# Patient Record
Sex: Male | Born: 1950 | Race: White | Hispanic: No | Marital: Married | Smoking: Never smoker
Health system: Southern US, Community
[De-identification: ages and names within clinical notes are randomized; demographics above are authoritative.]

## PROBLEM LIST (undated history)

## (undated) DIAGNOSIS — E119 Type 2 diabetes mellitus without complications: Secondary | ICD-10-CM

## (undated) DIAGNOSIS — T8859XA Other complications of anesthesia, initial encounter: Secondary | ICD-10-CM

## (undated) DIAGNOSIS — I10 Essential (primary) hypertension: Secondary | ICD-10-CM

## (undated) DIAGNOSIS — E785 Hyperlipidemia, unspecified: Secondary | ICD-10-CM

## (undated) DIAGNOSIS — T4145XA Adverse effect of unspecified anesthetic, initial encounter: Secondary | ICD-10-CM

## (undated) DIAGNOSIS — Z87442 Personal history of urinary calculi: Secondary | ICD-10-CM

## (undated) DIAGNOSIS — Z92241 Personal history of systemic steroid therapy: Secondary | ICD-10-CM

## (undated) HISTORY — PX: BACK SURGERY: SHX140

## (undated) HISTORY — PX: TONSILLECTOMY: SUR1361

## (undated) HISTORY — DX: Hyperlipidemia, unspecified: E78.5

## (undated) HISTORY — PX: OTHER SURGICAL HISTORY: SHX169

---

## 2000-09-04 ENCOUNTER — Encounter: Payer: Self-pay | Admitting: Emergency Medicine

## 2000-09-04 ENCOUNTER — Emergency Department (HOSPITAL_COMMUNITY): Admission: EM | Admit: 2000-09-04 | Discharge: 2000-09-04 | Payer: Self-pay | Admitting: Emergency Medicine

## 2001-01-19 ENCOUNTER — Ambulatory Visit (HOSPITAL_BASED_OUTPATIENT_CLINIC_OR_DEPARTMENT_OTHER): Admission: RE | Admit: 2001-01-19 | Discharge: 2001-01-19 | Payer: Self-pay | Admitting: General Surgery

## 2001-01-19 ENCOUNTER — Encounter (INDEPENDENT_AMBULATORY_CARE_PROVIDER_SITE_OTHER): Payer: Self-pay | Admitting: *Deleted

## 2001-02-03 ENCOUNTER — Emergency Department (HOSPITAL_COMMUNITY): Admission: EM | Admit: 2001-02-03 | Discharge: 2001-02-03 | Payer: Self-pay | Admitting: Emergency Medicine

## 2001-02-03 ENCOUNTER — Ambulatory Visit (HOSPITAL_COMMUNITY): Admission: RE | Admit: 2001-02-03 | Discharge: 2001-02-03 | Payer: Self-pay | Admitting: Urology

## 2001-02-03 ENCOUNTER — Encounter: Payer: Self-pay | Admitting: Emergency Medicine

## 2001-02-04 ENCOUNTER — Encounter: Payer: Self-pay | Admitting: Urology

## 2001-02-04 ENCOUNTER — Ambulatory Visit (HOSPITAL_COMMUNITY): Admission: RE | Admit: 2001-02-04 | Discharge: 2001-02-04 | Payer: Self-pay | Admitting: Urology

## 2006-11-18 ENCOUNTER — Encounter (INDEPENDENT_AMBULATORY_CARE_PROVIDER_SITE_OTHER): Payer: Self-pay | Admitting: Specialist

## 2006-11-18 ENCOUNTER — Ambulatory Visit (HOSPITAL_COMMUNITY): Admission: RE | Admit: 2006-11-18 | Discharge: 2006-11-18 | Payer: Self-pay | Admitting: *Deleted

## 2007-03-23 ENCOUNTER — Ambulatory Visit (HOSPITAL_COMMUNITY): Admission: RE | Admit: 2007-03-23 | Discharge: 2007-03-23 | Payer: Self-pay | Admitting: Orthopedic Surgery

## 2007-04-01 ENCOUNTER — Ambulatory Visit (HOSPITAL_COMMUNITY): Admission: RE | Admit: 2007-04-01 | Discharge: 2007-04-02 | Payer: Self-pay | Admitting: Orthopedic Surgery

## 2011-02-04 NOTE — Op Note (Signed)
Sean Hays, Sean Hays                ACCOUNT NO.:  [id number]   MEDICAL RECORD NO.:  [id number]          PATIENT TYPE:  OIB   LOCATION:  1613                         FACILITY:  [facility]   PHYSICIAN:  Georges Lynch. Gioffre, M.D.DATE OF BIRTH:  November 19, 1950   DATE OF PROCEDURE:  04/01/2007  DATE OF DISCHARGE:  04/02/2007                               OPERATIVE REPORT   SURGEON:  Dr. Darrelyn Hillock.   ASSISTANT:  Dr. Marlowe Kays.   PREOPERATIVE DIAGNOSES:  1. 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  2. 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   POSTOPERATIVE DIAGNOSES:  1. 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  2. 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   OPERATION:  1. Decompression of the lateral recess at L5-S1 on the right for      recess stenosis.  2. Microdiskectomy at L5-S1 on the right.  3. Foraminotomy of the S1 root on the right.   PROCEDURE:  Under general anesthesia, routine orthopedic prep and  draping of the lower back was carried out.  The patient was on spinal  frame.  Two needles were placed in the back for localization purpose, an  x-ray was taken.  At this time, an incision was made over the L5-S1  interspace in the usual fashion. This was done after the second x-ray  was taken to verify the position.  After the muscle was separated from  the lamina, I then inserted the [facility] retractors. I identified the  L5-S1 space, carried out my hemilaminectomy in the usual fashion.  Note  the ligamentum flavum was identified.  We brought the microscope in and  excised the ligamentum flavum.  We went down, the nerve root was  severely swollen and quite tense. We went out and first decompressed the  lateral recess to free up the root and we cauterized the lateral recess  veins with a bipolar.  We then explored the axilla of the root, it was  free.  We went up under the root and there was a  large fragment that  migrated distally up under the root.  This was gently removed.  There  were several other small fragments removed.  We then went up proximally  and went into the disk space in the usual fashion and cleaned out the  space.  We thoroughly irrigated out the area.  We were now able to  easily pass a hockey-stick out the S1 foramen for the root in that area.  We did a nice foraminotomy.  We searched several times for other small  pieces of disk and there were no further ones present.  The nerve root  now was easily mobile.  We then irrigated the area out, loosely applied  some thrombin-soaked Gelfoam.  The wound was closed in layers in the  usual fashion but I  did leave the distal deep portion of the distal  deep part of the wound open  for drainage purposes. The remaining part  of the wound was closed in the  usual fashion. The skin was closed with metal staples.  He had 2 grams  of IV Ancef preop.  He had Toradol 30 mg IV in the operating room at the  end of the procedure.  Sterile dressings were applied.  The patient left  the operating room in satisfactory condition.           ______________________________  Georges Lynch Darrelyn Hillock, M.D.     RAG/MEDQ  D:  04/01/2007  T:  04/02/2007  Job:  [id number]

## 2011-02-07 NOTE — Op Note (Signed)
Sean Hays, Sean Hays                ACCOUNT NO.:  [id number]   MEDICAL RECORD NO.:  [id number]          PATIENT TYPE:  AMB   LOCATION:  ENDO                         FACILITY:  [facility]   PHYSICIAN:  Georgiana Spinner, M.D.    DATE OF BIRTH:  1950/09/24   DATE OF PROCEDURE:  11/18/2006  DATE OF DISCHARGE:                               OPERATIVE REPORT   PROCEDURE:  Colonoscopy with polypectomy and biopsy.   INDICATIONS:  Colon polyps.   ANESTHESIA:  Demerol 100 mg, Versed 10 mg.   PROCEDURE:  With the patient mildly sedated in the left lateral  decubitus position, a rectal examination was performed which was  unremarkable to my exam.  Subsequently, the Pentax videoscopic  colonoscope was inserted into the rectum and passed under direct vision  to the cecum, identified by ileocecal valve and appendiceal orifice both  of which were photographed.  In the cecum, was a polyp that was removed  using snare cautery technique setting of 20/200 blended current.  There  was a small residual amount of polyp removed using hot biopsy forceps  technique with the same setting of 20/200.  From this point, the  colonoscope was slowly withdrawn taking circumferential views of the  colonic mucosa and then stopping only in the rectum which appeared  normal on direct and showed hemorrhoids on retroflexed view.  The  endoscope was straightened and withdrawn.  The patient's vital signs,  pulse oximeter remained stable.  The patient tolerated the procedure  well without apparent complication.   FINDINGS:  Polyp of cecum.  Internal hemorrhoids, otherwise unremarkable  exam.   PLAN:  Await biopsy report.  The patient will call me for results and  follow-up with me as needed as an outpatient.           ______________________________  Georgiana Spinner, M.D.     GMO/MEDQ  D:  11/18/2006  T:  11/18/2006  Job:  [id number]

## 2011-02-07 NOTE — Op Note (Signed)
[facility]  Patient:    Sean Hays, Sean Hays                       MRN: [id number] Proc. Date: 02/03/01 Adm. Date:  [id number] Attending:  Osvaldo Human                           Operative Report  DATE OF BIRTH:  July 07, 1951  REFERRING PHYSICIAN:  Hadassah Pais. Jeannetta Nap, M.D.  UROLOGIST:  Verl Dicker, M.D.  PREOPERATIVE DIAGNOSIS:  A 7 mm left ureteropelvic junction stone.  POSTOPERATIVE DIAGNOSIS:  A 7 mm left ureteropelvic junction stone.  PROCEDURE:  Cystoscopy, retrograde, left double-J stent placement.  ANESTHESIA:  Spinal.  DRAINS:  6 French 26 cm double-J stent.  DESCRIPTION OF PROCEDURE:  The patient was prepped and draped in the dorsal lithotomy position after institution of an adequate level of spinal anesthesia.  A well-lubricated 21 French panendoscope was gently inserted at the urethral meatus, normal urethra and sphincter, nonobstructive prostate, clear efflux right orifice, minimal efflux left orifice.  Right retrograde showed normal course and caliber of the ureter, pelvis, and calices with prompt drainage of three minutes.  Left retrograde showed normal course and caliber of the ureter, filling defects at the UPJ consistent with stone seen on CT scan with proximal hydronephrosis.  A 6 French end-hole catheter was advanced beneath the stones with gentle injection of contrast.  Stones were displaced into the renal pelvis.  End-hole catheter was advanced into the renal pelvis with immediate return of a "hydronephrotic drip."  Guidewire was then passed through the end-hole catheter, coiled nicely in the upper pole calices.  End-hole catheter was withdrawn and replaced with a 6 French 26 cm double-J stent with excellent pigtail formation on guidewire removal.  String was left in place, bladder was drained, cystoscope was removed.  The patient was returned to recovery in satisfactory condition. DD:  02/03/01 TD:  02/03/01 Job:  [id number]

## 2011-07-08 LAB — BASIC METABOLIC PANEL
CO2: 26
Calcium: 9.1
Creatinine, Ser: 0.95
Glucose, Bld: 137 — ABNORMAL HIGH

## 2011-07-08 LAB — APTT: aPTT: 25

## 2013-07-27 ENCOUNTER — Ambulatory Visit (HOSPITAL_COMMUNITY)
Admission: RE | Admit: 2013-07-27 | Discharge: 2013-07-27 | Disposition: A | Discharge status: Home / Self Care | Payer: 59 | Source: Ambulatory Visit | Attending: Orthopedic Surgery | Admitting: Orthopedic Surgery

## 2013-07-27 ENCOUNTER — Encounter (HOSPITAL_COMMUNITY): Payer: Self-pay | Admitting: Pharmacy Technician

## 2013-07-27 ENCOUNTER — Encounter (HOSPITAL_COMMUNITY)
Admission: RE | Admit: 2013-07-27 | Discharge: 2013-07-27 | Disposition: A | Discharge status: Home / Self Care | Payer: 59 | Source: Ambulatory Visit | Attending: Orthopedic Surgery | Admitting: Orthopedic Surgery

## 2013-07-27 ENCOUNTER — Encounter (HOSPITAL_COMMUNITY): Payer: Self-pay

## 2013-07-27 DIAGNOSIS — M5137 Other intervertebral disc degeneration, lumbosacral region: Secondary | ICD-10-CM | POA: Insufficient documentation

## 2013-07-27 DIAGNOSIS — Z01810 Encounter for preprocedural cardiovascular examination: Secondary | ICD-10-CM | POA: Insufficient documentation

## 2013-07-27 DIAGNOSIS — M79609 Pain in unspecified limb: Secondary | ICD-10-CM | POA: Insufficient documentation

## 2013-07-27 DIAGNOSIS — Z01812 Encounter for preprocedural laboratory examination: Secondary | ICD-10-CM | POA: Insufficient documentation

## 2013-07-27 DIAGNOSIS — Z01818 Encounter for other preprocedural examination: Secondary | ICD-10-CM | POA: Insufficient documentation

## 2013-07-27 DIAGNOSIS — Z92241 Personal history of systemic steroid therapy: Secondary | ICD-10-CM

## 2013-07-27 DIAGNOSIS — M51379 Other intervertebral disc degeneration, lumbosacral region without mention of lumbar back pain or lower extremity pain: Secondary | ICD-10-CM | POA: Insufficient documentation

## 2013-07-27 HISTORY — DX: Type 2 diabetes mellitus without complications: E11.9

## 2013-07-27 HISTORY — DX: Personal history of systemic steroid therapy: Z92.241

## 2013-07-27 HISTORY — DX: Essential (primary) hypertension: I10

## 2013-07-27 HISTORY — DX: Personal history of urinary calculi: Z87.442

## 2013-07-27 LAB — COMPREHENSIVE METABOLIC PANEL
AST: 20 U/L (ref 0–37)
Alkaline Phosphatase: 69 U/L (ref 39–117)
CO2: 29 mEq/L (ref 19–32)
Chloride: 100 mEq/L (ref 96–112)
Creatinine, Ser: 1.04 mg/dL (ref 0.50–1.35)
GFR calc non Af Amer: 74 mL/min — ABNORMAL LOW (ref >90)
Potassium: 3.8 mEq/L (ref 3.5–5.1)
Total Bilirubin: 0.5 mg/dL (ref 0.3–1.2)

## 2013-07-27 LAB — URINALYSIS, ROUTINE W REFLEX MICROSCOPIC
Glucose, UA: 250 mg/dL — AB
Hgb urine dipstick: NEGATIVE
Leukocytes, UA: NEGATIVE
Protein, ur: NEGATIVE mg/dL
Specific Gravity, Urine: 1.027 (ref 1.005–1.030)
pH: 5 (ref 5.0–8.0)

## 2013-07-27 LAB — APTT: aPTT: 26 seconds (ref 24–37)

## 2013-07-27 LAB — CBC
HCT: 43.5 % (ref 39.0–52.0)
MCV: 85.8 fL (ref 78.0–100.0)
Platelets: 197 10*3/uL (ref 150–400)
RBC: 5.07 MIL/uL (ref 4.22–5.81)
WBC: 8.4 10*3/uL (ref 4.0–10.5)

## 2013-07-27 LAB — SURGICAL PCR SCREEN: Staphylococcus aureus: NEGATIVE

## 2013-07-27 NOTE — Progress Notes (Signed)
07-27-13 0930 pt. Need MD order entry in Epic -here now. Will Do CBC,CMP,Pt,PTT,UA, Back xray, EKG, CXR. PCR screen.

## 2013-07-27 NOTE — Patient Instructions (Addendum)
20 Sean Hays  07/27/2013   Your procedure is scheduled on:   08-02-2013  Report to [facility] at     1:00  PM.  Call this number if you have problems the morning of surgery: [phone]  Or Presurgical Testing [phone](Alpha Mysliwiec)      Do not eat food:After Midnight.  May have clear liquids:up to 6 Hours before arrival. Nothing after : 0900 AM  Clear liquids include soda, tea, black coffee, apple or grape juice, broth.  Take these medicines the morning of surgery with A SIP OF WATER:  Tylenol. Atenolol. Oxycodone   Do not wear jewelry, make-up or nail polish.  Do not wear lotions, powders, or perfumes. You may wear deodorant.  Do not shave 12 hours prior to first CHG shower(legs and under arms).(face and neck okay.)  Do not bring valuables to the hospital.  Contacts, dentures or removable bridgework, body piercing, hair pins may not be worn into surgery.  Leave suitcase in the car. After surgery it may be brought to your room.  For patients admitted to the hospital, checkout time is 11:00 AM the day of discharge.   Patients discharged the day of surgery will not be allowed to drive home. Must have responsible person with you x 24 hours once discharged.  Name and phone number of your driver: [name]- spouse [phone] home  Special Instructions: CHG(Chlorhedine 4%-"Hibiclens","Betasept","Aplicare") Education officer, community Wash: see special instructions.(avoid face and genitals)   Please read over the following fact sheets that you were given: MRSA Information, Blood Transfusion fact sheet, Incentive Spirometry Instruction.  Remember : Type/Screen "Blue armbands" - may not be removed once applied(would result in being retested if removed).  Failure to follow these instructions may result in Cancellation of your surgery.   Patient signature_______________________________________________________

## 2013-07-31 NOTE — H&P (Signed)
Sean Hays is an 62 y.o. male.   Chief Complaint: back pain HPI: Sean Hays presented with the chief complaint of back pain. He had a microdiscectomy and hemilaminectomy at L5-S1 to the right in 2008. He did extremely well until the beginning of September. 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 He also started experiencing back pain when coughing. Conservative measures including anti-inflammatories, muscle relaxers, and heat application were used but did not relieve the pain. MRI showed moderate to severe neuroforaminal narrowing secondary to a disc herniation that is foraminal at L4-L5 on the left which corresponds with his left LE pain. He has some numbness and tingling in the left LE but no motor deficits.    Past Medical History  Diagnosis Date  . Hypertension   . Diabetes mellitus without complication   . History of kidney stones     X1-litho for this  . H/O steroid therapy 07-27-13    recent Dosepak use -completed 3 days ago    Past Surgical History  Procedure Laterality Date  . Back surgery      '08  . Lithotripsy    . Tonsillectomy     Social History Alcohol use. never consumed alcohol Children. 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 married Tobacco use. never smoker   Allergies: No Known Allergies  Current outpatient prescriptions: acetaminophen (TYLENOL) 500 MG tablet, Take 1,000 mg by mouth every 6 (six) hours as needed for mild pain or moderate pain., Disp: , Rfl: ;   aspirin EC 81 MG tablet, Take 81 mg by mouth daily., Disp: , Rfl: ;   atenolol (TENORMIN) 50 MG tablet, Take 50 mg by mouth 2 (two) times daily., Disp: , Rfl: ;   hydrochlorothiazide (HYDRODIURIL) 25 MG tablet, Take 25 mg by mouth every morning., Disp: , Rfl:  metFORMIN (GLUCOPHAGE) 500 MG tablet, Take 1,000 mg by mouth 2 (two) times daily with a meal., Disp: , Rfl: ;   oxyCODONE-acetaminophen (PERCOCET) 10-325  MG per tablet, Take 1 tablet by mouth every 4 (four) hours as needed for pain., Disp: , Rfl: ;  pravastatin (PRAVACHOL) 20 MG tablet, Take 20 mg by mouth every evening., Disp: , Rfl:   Review of Systems  Constitutional: Negative.   HENT: Negative.   Eyes: Negative.   Respiratory: Negative.   Cardiovascular: Negative.   Gastrointestinal: Negative.   Genitourinary: Negative.   Musculoskeletal: Positive for back pain and joint pain. Negative for falls, myalgias and neck pain.  Skin: Negative.   Neurological: Positive for tingling. Negative for dizziness, tremors, sensory change, focal weakness, seizures and loss of consciousness.  Endo/Heme/Allergies: Negative.   Psychiatric/Behavioral: Negative.     Vitals Weight: 215 lb Height: 69 in Body Surface Area: 2.18 m Body Mass Index: 31.75 kg/m BP: 154/93 (Sitting, Left Arm, Standard)  Hr: 72 bpm   Physical Exam  Constitutional: He is oriented to person, place, and time. He appears well-developed and well-nourished. No distress.  HENT:  Head: Normocephalic and atraumatic.  Right Ear: External ear normal.  Left Ear: External ear normal.  Nose: Nose normal.  Mouth/Throat: Oropharynx is clear and moist.  Eyes: Conjunctivae and EOM are normal.  Neck: Normal range of motion. Neck supple.  Cardiovascular: Normal rate, regular rhythm, normal heart sounds and intact distal pulses.   No murmur heard. Respiratory: Effort normal and breath sounds normal. No respiratory distress. He has no wheezes.  GI: Soft. Bowel sounds are normal.  He exhibits no distension. There is no tenderness.  Musculoskeletal:       Right hip: Normal.       Left hip: Normal.       Right knee: Normal.       Left knee: Normal.       Lumbar back: He exhibits decreased range of motion, tenderness, pain and spasm.       Right lower leg: He exhibits no tenderness and no swelling.       Left lower leg: He exhibits no tenderness and no swelling.  Positive SLR on  the left  Neurological: He is alert and oriented to person, place, and time. He has normal strength and normal reflexes. No sensory deficit.  Skin: No rash noted. He is not diaphoretic. No erythema.     Psychiatric: He has a normal mood and affect. His behavior is normal.     Assessment/Plan Lumbar disc herniaiton, L4-L5 left He needs a lumbar hemilaminectomy and microdiscectomy at L4-L5 on the left. The possible complications of spinal surgery number one could be infection, which is extremely rare. We do use antibiotics prior to the surgery and during surgery and after surgery. Number two is always a slight degree of probability that you could develop a blood clot in your leg after any type of surgery and we try our best to prevent that with aspirin post op when it is safe to begin. The third is a dural leak. That is the spinal fluid leak that could occur. At certain rare times the bone or the disc could literally stick to the dura which is the lining which contains the spinal fluid and we could develop a small tear in that lining which we then patch up. That is an extremely rare complication. The last and final complication is a recurrent disc rupture. That means that you could rupture another small piece of disc later on down the road and there is about a 2% chance of that.   Sean Hays 07/31/2013, 11:32 AM

## 2013-08-02 ENCOUNTER — Encounter (HOSPITAL_COMMUNITY): Payer: 59 | Admitting: Anesthesiology

## 2013-08-02 ENCOUNTER — Ambulatory Visit (HOSPITAL_COMMUNITY)
Admission: RE | Admit: 2013-08-02 | Discharge: 2013-08-03 | Disposition: A | Discharge status: Home / Self Care | Payer: 59 | Source: Ambulatory Visit | Attending: Orthopedic Surgery | Admitting: Orthopedic Surgery

## 2013-08-02 ENCOUNTER — Ambulatory Visit (HOSPITAL_COMMUNITY): Payer: 59

## 2013-08-02 ENCOUNTER — Encounter (HOSPITAL_COMMUNITY)
Admission: RE | Disposition: A | Discharge status: Home / Self Care | Payer: Self-pay | Source: Ambulatory Visit | Attending: Orthopedic Surgery

## 2013-08-02 ENCOUNTER — Encounter (HOSPITAL_COMMUNITY): Payer: Self-pay | Admitting: *Deleted

## 2013-08-02 ENCOUNTER — Ambulatory Visit (HOSPITAL_COMMUNITY): Payer: 59 | Admitting: Anesthesiology

## 2013-08-02 DIAGNOSIS — M48062 Spinal stenosis, lumbar region with neurogenic claudication: Secondary | ICD-10-CM | POA: Diagnosis present

## 2013-08-02 DIAGNOSIS — E119 Type 2 diabetes mellitus without complications: Secondary | ICD-10-CM | POA: Insufficient documentation

## 2013-08-02 DIAGNOSIS — M216X9 Other acquired deformities of unspecified foot: Secondary | ICD-10-CM | POA: Insufficient documentation

## 2013-08-02 DIAGNOSIS — M5126 Other intervertebral disc displacement, lumbar region: Secondary | ICD-10-CM | POA: Insufficient documentation

## 2013-08-02 DIAGNOSIS — M48061 Spinal stenosis, lumbar region without neurogenic claudication: Secondary | ICD-10-CM | POA: Insufficient documentation

## 2013-08-02 DIAGNOSIS — I10 Essential (primary) hypertension: Secondary | ICD-10-CM | POA: Insufficient documentation

## 2013-08-02 HISTORY — PX: LUMBAR LAMINECTOMY/DECOMPRESSION MICRODISCECTOMY: SHX5026

## 2013-08-02 LAB — GLUCOSE, CAPILLARY
Glucose-Capillary: 112 mg/dL — ABNORMAL HIGH (ref 70–99)
Glucose-Capillary: 137 mg/dL — ABNORMAL HIGH (ref 70–99)
Glucose-Capillary: 108 mg/dL — ABNORMAL HIGH (ref 70–99)

## 2013-08-02 LAB — TYPE AND SCREEN
ABO/RH(D): O NEG
Antibody Screen: NEGATIVE

## 2013-08-02 SURGERY — LUMBAR LAMINECTOMY/DECOMPRESSION MICRODISCECTOMY 1 LEVEL
Anesthesia: General | Site: Back | Laterality: Left | Wound class: Clean

## 2013-08-02 MED ORDER — POLYETHYLENE GLYCOL 3350 17 G PO PACK
17.0000 g | PACK | Freq: Every day | ORAL | Status: DC | PRN
  Filled 2013-08-02: qty 1

## 2013-08-02 MED ORDER — METHOCARBAMOL 100 MG/ML IJ SOLN
500.0000 mg | Freq: Four times a day (QID) | INTRAVENOUS | Status: DC | PRN
  Filled 2013-08-02: qty 5

## 2013-08-02 MED ORDER — HYDROMORPHONE HCL PF 1 MG/ML IJ SOLN
0.2500 mg | INTRAMUSCULAR | Status: DC | PRN
  Administered 2013-08-02: 0.5 mg via INTRAVENOUS

## 2013-08-02 MED ORDER — NEOSTIGMINE METHYLSULFATE 1 MG/ML IJ SOLN
INTRAMUSCULAR | Status: DC | PRN
  Administered 2013-08-02: 2 mg via INTRAVENOUS

## 2013-08-02 MED ORDER — FLEET ENEMA 7-19 GM/118ML RE ENEM: 1.0000 | ENEMA | Freq: Once | RECTAL | Status: AC | PRN

## 2013-08-02 MED ORDER — HYDROCODONE-ACETAMINOPHEN 5-325 MG PO TABS
1.0000 | ORAL_TABLET | ORAL | Status: DC | PRN
  Administered 2013-08-03: 1 via ORAL
  Filled 2013-08-02: qty 1

## 2013-08-02 MED ORDER — PROMETHAZINE HCL 25 MG/ML IJ SOLN: 6.2500 mg | INTRAMUSCULAR | Status: DC | PRN

## 2013-08-02 MED ORDER — ACETAMINOPHEN 325 MG PO TABS: 650.0000 mg | ORAL_TABLET | ORAL | Status: DC | PRN

## 2013-08-02 MED ORDER — LIDOCAINE HCL (PF) 2 % IJ SOLN
INTRAMUSCULAR | Status: DC | PRN
  Administered 2013-08-02: 40 mg

## 2013-08-02 MED ORDER — BUPIVACAINE-EPINEPHRINE PF 0.5-1:200000 % IJ SOLN
INTRAMUSCULAR | Status: AC
  Filled 2013-08-02: qty 30

## 2013-08-02 MED ORDER — BUPIVACAINE LIPOSOME 1.3 % IJ SUSP
INTRAMUSCULAR | Status: DC | PRN
  Administered 2013-08-02: 20 mL

## 2013-08-02 MED ORDER — BACITRACIN-NEOMYCIN-POLYMYXIN 400-5-5000 EX OINT
TOPICAL_OINTMENT | CUTANEOUS | Status: AC
  Filled 2013-08-02: qty 1

## 2013-08-02 MED ORDER — BUPIVACAINE LIPOSOME 1.3 % IJ SUSP
20.0000 mL | Freq: Once | INTRAMUSCULAR | Status: DC
  Filled 2013-08-02: qty 20

## 2013-08-02 MED ORDER — ONDANSETRON HCL 4 MG/2ML IJ SOLN
INTRAMUSCULAR | Status: DC | PRN
  Administered 2013-08-02: 4 mg via INTRAVENOUS

## 2013-08-02 MED ORDER — GLYCOPYRROLATE 0.2 MG/ML IJ SOLN
INTRAMUSCULAR | Status: DC | PRN
  Administered 2013-08-02: 0.4 mg via INTRAVENOUS

## 2013-08-02 MED ORDER — BISACODYL 10 MG RE SUPP: 10.0000 mg | Freq: Every day | RECTAL | Status: DC | PRN

## 2013-08-02 MED ORDER — PHENOL 1.4 % MT LIQD: 1.0000 | OROMUCOSAL | Status: DC | PRN

## 2013-08-02 MED ORDER — HYDROMORPHONE HCL PF 1 MG/ML IJ SOLN: 0.5000 mg | INTRAMUSCULAR | Status: DC | PRN

## 2013-08-02 MED ORDER — ACETAMINOPHEN 650 MG RE SUPP: 650.0000 mg | RECTAL | Status: DC | PRN

## 2013-08-02 MED ORDER — MIDAZOLAM HCL 5 MG/5ML IJ SOLN
INTRAMUSCULAR | Status: DC | PRN
  Administered 2013-08-02: 2 mg via INTRAVENOUS

## 2013-08-02 MED ORDER — FENTANYL CITRATE 0.05 MG/ML IJ SOLN
INTRAMUSCULAR | Status: DC | PRN
  Administered 2013-08-02: 100 ug via INTRAVENOUS
  Administered 2013-08-02: 50 ug via INTRAVENOUS
  Administered 2013-08-02: 100 ug via INTRAVENOUS

## 2013-08-02 MED ORDER — INSULIN ASPART 100 UNIT/ML ~~LOC~~ SOLN
0.0000 [IU] | Freq: Three times a day (TID) | SUBCUTANEOUS | Status: DC
  Administered 2013-08-03: 2 [IU] via SUBCUTANEOUS

## 2013-08-02 MED ORDER — THROMBIN 5000 UNITS EX SOLR
CUTANEOUS | Status: DC | PRN
  Administered 2013-08-02: 10000 [IU] via TOPICAL

## 2013-08-02 MED ORDER — HYDROCHLOROTHIAZIDE 25 MG PO TABS
25.0000 mg | ORAL_TABLET | Freq: Every morning | ORAL | Status: DC
  Administered 2013-08-03: 25 mg via ORAL
  Filled 2013-08-02: qty 1

## 2013-08-02 MED ORDER — CEFAZOLIN SODIUM-DEXTROSE 2-3 GM-% IV SOLR
INTRAVENOUS | Status: AC
  Filled 2013-08-02: qty 50

## 2013-08-02 MED ORDER — SODIUM CHLORIDE 0.9 % IR SOLN
Status: DC | PRN
  Administered 2013-08-02: 17:00:00

## 2013-08-02 MED ORDER — OXYCODONE-ACETAMINOPHEN 5-325 MG PO TABS: 1.0000 | ORAL_TABLET | ORAL | Status: DC | PRN

## 2013-08-02 MED ORDER — LACTATED RINGERS IV SOLN
INTRAVENOUS | Status: DC
  Administered 2013-08-02: 18:00:00 via INTRAVENOUS
  Administered 2013-08-02: 1000 mL via INTRAVENOUS

## 2013-08-02 MED ORDER — SIMVASTATIN 5 MG PO TABS
5.0000 mg | ORAL_TABLET | Freq: Every day | ORAL | Status: DC
  Filled 2013-08-02: qty 1

## 2013-08-02 MED ORDER — ONDANSETRON HCL 4 MG/2ML IJ SOLN: 4.0000 mg | INTRAMUSCULAR | Status: DC | PRN

## 2013-08-02 MED ORDER — ATENOLOL 50 MG PO TABS
50.0000 mg | ORAL_TABLET | Freq: Two times a day (BID) | ORAL | Status: DC
  Administered 2013-08-02 – 2013-08-03 (×2): 50 mg via ORAL
  Filled 2013-08-02 (×3): qty 1

## 2013-08-02 MED ORDER — PROPOFOL 10 MG/ML IV BOLUS
INTRAVENOUS | Status: DC | PRN
  Administered 2013-08-02: 200 mg via INTRAVENOUS

## 2013-08-02 MED ORDER — THROMBIN 5000 UNITS EX SOLR
CUTANEOUS | Status: AC
  Filled 2013-08-02: qty 10000

## 2013-08-02 MED ORDER — LACTATED RINGERS IV SOLN
INTRAVENOUS | Status: DC
  Administered 2013-08-02: via INTRAVENOUS

## 2013-08-02 MED ORDER — SUCCINYLCHOLINE CHLORIDE 20 MG/ML IJ SOLN
INTRAMUSCULAR | Status: DC | PRN
  Administered 2013-08-02: 100 mg via INTRAVENOUS

## 2013-08-02 MED ORDER — BUPIVACAINE-EPINEPHRINE 0.5% -1:200000 IJ SOLN
INTRAMUSCULAR | Status: DC | PRN
  Administered 2013-08-02: 20 mL

## 2013-08-02 MED ORDER — METHOCARBAMOL 500 MG PO TABS: 500.0000 mg | ORAL_TABLET | Freq: Four times a day (QID) | ORAL | Status: DC | PRN

## 2013-08-02 MED ORDER — CEFAZOLIN SODIUM-DEXTROSE 2-3 GM-% IV SOLR
2.0000 g | INTRAVENOUS | Status: AC
  Administered 2013-08-02: 2 g via INTRAVENOUS

## 2013-08-02 MED ORDER — HYDROMORPHONE HCL PF 1 MG/ML IJ SOLN
INTRAMUSCULAR | Status: AC
  Filled 2013-08-02: qty 1

## 2013-08-02 MED ORDER — BACITRACIN-NEOMYCIN-POLYMYXIN 400-5-5000 EX OINT
TOPICAL_OINTMENT | CUTANEOUS | Status: DC | PRN
  Administered 2013-08-02: 1 via TOPICAL

## 2013-08-02 MED ORDER — ROCURONIUM BROMIDE 100 MG/10ML IV SOLN
INTRAVENOUS | Status: DC | PRN
  Administered 2013-08-02: 30 mg via INTRAVENOUS
  Administered 2013-08-02 (×2): 10 mg via INTRAVENOUS

## 2013-08-02 MED ORDER — CEFAZOLIN SODIUM 1-5 GM-% IV SOLN
1.0000 g | Freq: Three times a day (TID) | INTRAVENOUS | Status: DC
  Administered 2013-08-02 – 2013-08-03 (×2): 1 g via INTRAVENOUS
  Filled 2013-08-02 (×5): qty 50

## 2013-08-02 MED ORDER — MENTHOL 3 MG MT LOZG: 1.0000 | LOZENGE | OROMUCOSAL | Status: DC | PRN

## 2013-08-02 SURGICAL SUPPLY — 45 items
APL SKNCLS STERI-STRIP NONHPOA (GAUZE/BANDAGES/DRESSINGS) ×1
BAG SPEC THK2 15X12 ZIP CLS (MISCELLANEOUS) ×1
BAG ZIPLOCK 12X15 (MISCELLANEOUS) ×2 IMPLANT
BENZOIN TINCTURE PRP APPL 2/3 (GAUZE/BANDAGES/DRESSINGS) ×2 IMPLANT
CLEANER TIP ELECTROSURG 2X2 (MISCELLANEOUS) ×2 IMPLANT
CLOTH BEACON ORANGE TIMEOUT ST (SAFETY) ×2 IMPLANT
CONT SPECI 4OZ STER CLIK (MISCELLANEOUS) ×2 IMPLANT
DRAIN PENROSE 18X1/4 LTX STRL (WOUND CARE) IMPLANT
DRAPE MICROSCOPE LEICA (MISCELLANEOUS) ×2 IMPLANT
DRAPE POUCH INSTRU U-SHP 10X18 (DRAPES) ×2 IMPLANT
DRAPE SURG 17X11 SM STRL (DRAPES) ×2 IMPLANT
DRSG ADAPTIC 3X8 NADH LF (GAUZE/BANDAGES/DRESSINGS) ×2 IMPLANT
DRSG PAD ABDOMINAL 8X10 ST (GAUZE/BANDAGES/DRESSINGS) ×5 IMPLANT
DURAPREP 26ML APPLICATOR (WOUND CARE) ×2 IMPLANT
ELECT REM PT RETURN 9FT ADLT (ELECTROSURGICAL) ×2
ELECTRODE REM PT RTRN 9FT ADLT (ELECTROSURGICAL) ×1 IMPLANT
GLOVE BIOGEL PI IND STRL 8 (GLOVE) ×2 IMPLANT
GLOVE BIOGEL PI INDICATOR 8 (GLOVE) ×2
GLOVE ECLIPSE 8.0 STRL XLNG CF (GLOVE) ×4 IMPLANT
GOWN PREVENTION PLUS LG XLONG (DISPOSABLE) ×6 IMPLANT
GOWN STRL REIN XL XLG (GOWN DISPOSABLE) ×4 IMPLANT
KIT BASIN OR (CUSTOM PROCEDURE TRAY) ×2 IMPLANT
KIT POSITIONING SURG ANDREWS (MISCELLANEOUS) ×2 IMPLANT
MANIFOLD NEPTUNE II (INSTRUMENTS) ×2 IMPLANT
NDL SPNL 18GX3.5 QUINCKE PK (NEEDLE) ×2 IMPLANT
NEEDLE SPNL 18GX3.5 QUINCKE PK (NEEDLE) ×4 IMPLANT
NS IRRIG 1000ML POUR BTL (IV SOLUTION) ×2 IMPLANT
PATTIES SURGICAL .5 X.5 (GAUZE/BANDAGES/DRESSINGS) IMPLANT
PATTIES SURGICAL .75X.75 (GAUZE/BANDAGES/DRESSINGS) IMPLANT
PATTIES SURGICAL 1X1 (DISPOSABLE) IMPLANT
PIN SAFETY NICK PLATE  2 MED (MISCELLANEOUS)
PIN SAFETY NICK PLATE 2 MED (MISCELLANEOUS) IMPLANT
POSITIONER SURGICAL ARM (MISCELLANEOUS) ×2 IMPLANT
SPONGE GAUZE 4X4 12PLY (GAUZE/BANDAGES/DRESSINGS) ×1 IMPLANT
SPONGE LAP 4X18 X RAY DECT (DISPOSABLE) IMPLANT
SPONGE SURGIFOAM ABS GEL 100 (HEMOSTASIS) ×2 IMPLANT
STAPLER VISISTAT 35W (STAPLE) IMPLANT
SUT VIC AB 0 CT1 27 (SUTURE) ×2
SUT VIC AB 0 CT1 27XBRD ANTBC (SUTURE) ×1 IMPLANT
SUT VIC AB 1 CT1 27 (SUTURE) ×8
SUT VIC AB 1 CT1 27XBRD ANTBC (SUTURE) ×4 IMPLANT
TAPE CLOTH SURG 6X10 WHT LF (GAUZE/BANDAGES/DRESSINGS) ×1 IMPLANT
TOWEL OR 17X26 10 PK STRL BLUE (TOWEL DISPOSABLE) ×4 IMPLANT
TRAY LAMINECTOMY (CUSTOM PROCEDURE TRAY) ×2 IMPLANT
WATER STERILE IRR 1500ML POUR (IV SOLUTION) ×2 IMPLANT

## 2013-08-02 NOTE — Transfer of Care (Signed)
Immediate Anesthesia Transfer of Care Note  Patient: Sean Hays  Procedure(s) Performed: Procedure(s): LUMBAR LAMINECTOMY L4 - L5/MICRODISCECTOMY ON THE LEFT 1 LEVEL (Left)  Patient Location: PACU  Anesthesia Type:General  Level of Consciousness: awake, patient cooperative, confused and responds to stimulation  Airway & Oxygen Therapy: Patient Spontanous Breathing and Patient connected to face mask oxygen  Post-op Assessment: Report given to PACU RN, Post -op Vital signs reviewed and stable and Patient moving all extremities  Post vital signs: Reviewed and stable  Complications: No apparent anesthesia complications

## 2013-08-02 NOTE — Brief Op Note (Signed)
08/02/2013  6:20 PM  PATIENT:  Sean Hays  62 y.o. male  PRE-OPERATIVE DIAGNOSIS:  Herniated DiscL-4-L-5 on the left and Severe Lateral Recess Stenosis  POST-OPERATIVE DIAGNOSIS:  Herniated Disc at L-4-L-5 on the left and Severe Lateral Recess Stenosis  PROCEDURE:  Procedure(s): LUMBAR LAMINECTOMY L4 - L5/MICRODISCECTOMY ON THE LEFT 1 LEVEL (Left) and Decompression of Lateral Recess on the Left at L-4-L-5 and Foraminotomies for TWO Nerve Roots left-4 and L-5.  SURGEON:  Surgeon(s) and Role:    * Jacki Cones, MD - Primary    * Drucilla Schmidt, MD - Assisting    ASSISTANTS: Marlowe Kays MD  ANESTHESIA:   general  EBL:  Total I/O In: -  Out: 100 [Blood:100]  BLOOD ADMINISTERED:none  DRAINS: none   LOCAL MEDICATIONS USED:  MARCAINE 20cc of 0.50% with Epinephrine at beginning of case and 20cc of Exparel at the end of the case.   SPECIMEN:  No Specimen  DISPOSITION OF SPECIMEN:  N/A  COUNTS:  YES  TOURNIQUET:  * No tourniquets in log *  DICTATION: .Other Dictation: Dictation Number [id number]  PLAN OF CARE: Admit for overnight observation  PATIENT DISPOSITION:  PACU - hemodynamically stable.   Delay start of Pharmacological VTE agent (>24hrs) due to surgical blood loss or risk of bleeding: yes

## 2013-08-02 NOTE — Preoperative (Addendum)
Beta Blockers   Reason not to administer Beta Blockers:Not Applicable 

## 2013-08-02 NOTE — Anesthesia Preprocedure Evaluation (Signed)
Anesthesia Evaluation  Patient identified by MRN, date of birth, ID band Patient awake    Reviewed: Allergy & Precautions, H&P , NPO status , Patient's Chart, lab work & pertinent test results  Airway Mallampati: II TM Distance: <3 FB Neck ROM: Full    Dental no notable dental hx.    Pulmonary neg pulmonary ROS,  breath sounds clear to auscultation  Pulmonary exam normal       Cardiovascular hypertension, Pt. on medications Rhythm:Regular Rate:Normal     Neuro/Psych negative neurological ROS  negative psych ROS   GI/Hepatic negative GI ROS, Neg liver ROS,   Endo/Other  diabetes, Type 2  Renal/GU negative Renal ROS  negative genitourinary   Musculoskeletal negative musculoskeletal ROS (+)   Abdominal   Peds negative pediatric ROS (+)  Hematology negative hematology ROS (+)   Anesthesia Other Findings   Reproductive/Obstetrics negative OB ROS                           Anesthesia Physical Anesthesia Plan  ASA: II  Anesthesia Plan: General   Post-op Pain Management:    Induction: Intravenous  Airway Management Planned: Oral ETT  Additional Equipment:   Intra-op Plan:   Post-operative Plan: Extubation in OR  Informed Consent: I have reviewed the patients History and Physical, chart, labs and discussed the procedure including the risks, benefits and alternatives for the proposed anesthesia with the patient or authorized representative who has indicated his/her understanding and acceptance.   Dental advisory given  Plan Discussed with: CRNA and Surgeon  Anesthesia Plan Comments:         Anesthesia Quick Evaluation

## 2013-08-02 NOTE — Anesthesia Postprocedure Evaluation (Signed)
  Anesthesia Post-op Note  Patient: Sean Hays  Procedure(s) Performed: Procedure(s) (LRB): LUMBAR LAMINECTOMY L4 - L5/MICRODISCECTOMY ON THE LEFT 1 LEVEL (Left)  Patient Location: PACU  Anesthesia Type: General  Level of Consciousness: awake and alert   Airway and Oxygen Therapy: Patient Spontanous Breathing  Post-op Pain: mild  Post-op Assessment: Post-op Vital signs reviewed, Patient's Cardiovascular Status Stable, Respiratory Function Stable, Patent Airway and No signs of Nausea or vomiting  Last Vitals:  Filed Vitals:   08/02/13 1845  BP:   Pulse: 75  Temp:   Resp: 20    Post-op Vital Signs: stable   Complications: No apparent anesthesia complications

## 2013-08-02 NOTE — Interval H&P Note (Signed)
History and Physical Interval Note:  08/02/2013 3:28 PM  Sean Hays  has presented today for surgery, with the diagnosis of Herniated Disc  The various methods of treatment have been discussed with the patient and family. After consideration of risks, benefits and other options for treatment, the patient has consented to  Procedure(s): LUMBAR LAMINECTOMY L4 - L5/MICRODISCECTOMY ON THE LEFT 1 LEVEL (Left) as a surgical intervention .  The patient's history has been reviewed, patient examined, no change in status, stable for surgery.  I have reviewed the patient's chart and labs.  Questions were answered to the patient's satisfaction.     [name]

## 2013-08-03 ENCOUNTER — Encounter (HOSPITAL_COMMUNITY): Payer: Self-pay

## 2013-08-03 LAB — GLUCOSE, CAPILLARY: Glucose-Capillary: 150 mg/dL — ABNORMAL HIGH (ref 70–99)

## 2013-08-03 MED ORDER — HYDROCODONE-ACETAMINOPHEN 5-325 MG PO TABS: 1.0000 | ORAL_TABLET | ORAL | Status: DC | PRN

## 2013-08-03 MED ORDER — METHOCARBAMOL 500 MG PO TABS: 500.0000 mg | ORAL_TABLET | Freq: Four times a day (QID) | ORAL | Status: DC | PRN

## 2013-08-03 MED ORDER — OXYCODONE HCL 5 MG PO TABS: 5.0000 mg | ORAL_TABLET | ORAL | Status: DC | PRN

## 2013-08-03 NOTE — Progress Notes (Signed)
Pt discharged home with wife via wheelchair. Pt verbalized understanding d/c instructions, prescriptions, and follow-up care. Pt denies having pain at time of discharge.

## 2013-08-03 NOTE — Progress Notes (Signed)
OT Cancellation Note  Patient Details Name: KYLYN SOOKRAM MRN: [id number] DOB: [birth year]   Cancelled Treatment:     Pt was screened this am for acute OT. Pt and pt's wife reports no needs at this time as he will have assist at home PRN. Will sign off.  Roselie Awkward Dixon 08/03/2013, 9:48 AM

## 2013-08-03 NOTE — Progress Notes (Signed)
   Subjective: 1 Day Post-Op Procedure(s) (LRB): LUMBAR LAMINECTOMY L4 - L5/MICRODISCECTOMY ON THE LEFT 1 LEVEL (Left) Patient reports pain as mild.   Patient seen in rounds without Dr. Darrelyn Hillock. Patient is well, and has had no acute complaints or problems. He reports that he is feeling better although he has some lingering discomfort in his left leg. He did not sleep but a couple hours last night but no issues overnight. No SOB or chest pain. Voiding well.  Plan is to go Home after hospital stay.  Objective: Vital signs in last 24 hours: Temp:  [97.5 F (36.4 C)-99.2 F (37.3 C)] 98.1 F (36.7 C) (11/12 0532) Pulse Rate:  [70-85] 70 (11/12 0532) Resp:  [16-20] 16 (11/12 0532) BP: (103-154)/(53-94) 145/85 mmHg (11/12 0532) SpO2:  [95 %-100 %] 99 % (11/12 0532)  Intake/Output from previous day:  Intake/Output Summary (Last 24 hours) at 08/03/13 0853 Last data filed at 08/02/13 1858  Gross per 24 hour  Intake   1400 ml  Output    100 ml  Net   1300 ml     EXAM General - Patient is Alert and Oriented Extremity - Neurologically intact Neurovascular intact Dorsiflexion/Plantar flexion intact Dressing/Incision - clean, dry, no drainage Motor Function - intact, moving foot and toes well on exam.   Past Medical History  Diagnosis Date  . Hypertension   . Diabetes mellitus without complication   . History of kidney stones     X1-litho for this  . H/O steroid therapy 07-27-13    recent Dosepak use -completed 3 days ago    Assessment/Plan: 1 Day Post-Op Procedure(s) (LRB): LUMBAR LAMINECTOMY L4 - L5/MICRODISCECTOMY ON THE LEFT 1 LEVEL (Left) Active Problems:   Spinal stenosis, lumbar region, with neurogenic claudication  Advance diet Up with therapy D/C IV fluids Discharge home   DVT Prophylaxis - Aspirin Weight-Bearing as tolerated   He is doing well this morning. Will have him walk with PT. If he progresses well, he is prepared for discharge home today. Discharge  instructions given.   Ariannie Penaloza LAUREN 08/03/2013, 8:53 AM

## 2013-08-03 NOTE — Progress Notes (Signed)
Physical Therapy Treatment Patient Details Name: Sean Hays MRN: [id number] DOB: June 13, 1951 Today's Date: 08/03/2013 Time:  -     PT Assessment / Plan / Recommendation  History of Present Illness s/p L4-5 laminectomy   PT Comments     Follow Up Recommendations  No PT follow up     Does the patient have the potential to tolerate intense rehabilitation     Barriers to Discharge        Equipment Recommendations  None recommended by PT    Recommendations for Other Services    Frequency     Progress towards PT Goals    Plan      Precautions / Restrictions Precautions Precautions: Back Precaution Comments: handouts issued Restrictions Other Position/Activity Restrictions: LUMBAR LAMINECTOMY L4 - L5/MICRODISCECTOMY ON THE LEFT 1 LEVEL    Pertinent Vitals/Pain    Mobility  Bed Mobility Bed Mobility: Rolling Left;Left Sidelying to Sit Rolling Left: 5: Supervision Left Sidelying to Sit: 5: Supervision Details for Bed Mobility Assistance: cues for technique Transfers Transfers: Sit to Stand;Stand to Sit Sit to Stand: 5: Supervision;6: Modified independent (Device/Increase time) Stand to Sit: 5: Supervision;6: Modified independent (Device/Increase time) Details for Transfer Assistance: cues for back precautions Ambulation/Gait Ambulation Distance (Feet): 400 Feet Assistive device: None Gait Pattern: Step-through pattern Stairs: Yes Stairs Assistance: 6: Modified independent (Device/Increase time);5: Supervision Stairs Assistance Details (indicate cue type and reason): 5 Stair Management Technique: No rails;One rail Right    Exercises  UE/LEs [facility] for activities tested   PT Diagnosis:    PT Problem List:   PT Treatment Interventions:     PT Goals (current goals can now be found in the care plan section)    Visit Information  Last PT Received On: 08/03/13 Assistance Needed: +1 History of Present Illness: s/p L4-5 laminectomy    Subjective Data       Cognition  Cognition Arousal/Alertness: Awake/alert Behavior During Therapy: WFL for tasks assessed/performed Overall Cognitive Status: Within Functional Limits for tasks assessed    Balance     End of Session PT - End of Session Activity Tolerance: Patient tolerated treatment well Patient left: in chair;with call bell/phone within reach;with family/visitor present Nurse Communication: Mobility status   GP Functional Assessment Tool Used: clinical judgement Functional Limitation: Mobility: Walking and moving around Mobility: Walking and Moving Around Current Status [phone]): At least 1 percent but less than 20 percent impaired, limited or restricted Mobility: Walking and Moving Around Goal Status [phone]): At least 1 percent but less than 20 percent impaired, limited or restricted Mobility: Walking and Moving Around Discharge Status [phone]): At least 1 percent but less than 20 percent impaired, limited or restricted   [facility] 08/03/2013, 4:30 PM

## 2013-08-05 NOTE — Op Note (Signed)
NAMECHANSE, KAGEL                ACCOUNT NO.:  [id number]  MEDICAL RECORD NO.:  [id number]  LOCATION:  1506                         FACILITY:  [facility]  PHYSICIAN:  Georges Lynch. Taevyn Hausen, M.D.DATE OF BIRTH:  [birth year]  DATE OF PROCEDURE: DATE OF DISCHARGE:  08/03/2013                              OPERATIVE REPORT   PREOPERATIVE DIAGNOSIS: 1. 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 2. Partial foot drop on the left. 3. Severe lateral recess stenosis and foraminal stenosis at L4-5 on     the left.  POSTOPERATIVE DIAGNOSIS: 1. 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 2. Partial foot drop on the left. 3. Severe lateral recess stenosis and foraminal stenosis at L4-5 on     the left.  SURGEON:  Georges Lynch. Darrelyn Hillock, M.D.  ASSISTANT:  Marlowe Kays, M.D.  PROCEDURE: 1. Decompression of the lateral recess for severe lateral recess     stenosis at L4-5 on the left. 2. Foraminotomies for the L4 root #3, foraminotomy for the L5 root,     both on the left. 3. Microdiskectomy at L4-5 on the left for foraminal disk.  DESCRIPTION OF PROCEDURE:  Under general anesthesia, routine orthopedic prep and drape in the lower back was carried out.  He had 2 g of IV Ancef.  The appropriate time-out was first carried out.  I also marked the appropriate left side of the back in the holding area.  He had 2 g of IV Ancef.  At this time, 2 needles were placed in the back for localization purposes.  An x-ray was taken.  Following that, an incision was made over the L4-5 space in the midline.  Bleeders were identified and cauterized.  Prior to making incision, I injected 20 mL of 0.5% Marcaine and epinephrine on the both sides of the spinous process.  Once the muscle was stripped from the spinous process of lamina, instruments were in place for another x-ray.  I then went down and carried out a hemilaminectomy at L4-5 in usual  fashion.  A microscope was brought in. Note, the recess and the space was very tight with great deal of time was taken to make sure we did not injure the dura.  At this time, we then carried out our dissection distally and proximally.  I then far laterally to decompress to recess.  We also did foraminotomies for the L4 root and the L5 root on the left.  The ligamentum flavum was removed gently.  At this time, we identified the dura and we identified the L5 root.  We did a nice foraminotomy first to free up the 5 root.  We then gently retracted the root went up proximally and did a foraminotomy for the root above.  We then identified the disk space by needle be in place and displaced and x-ray was taken to verify the L4-5 space on the left. At this time, a cruciate incision was made in the posterior longitudinal ligament.  Bleeders were then identified and cauterized.  I then carried out my microdiskectomy.  I  utilized the nerve hook and the Epstein curettes, went out far laterally into the foramen to make sure we decompressed the disk into the space and we carried out our microdiskectomy.  When the procedure was completed, we had easily accessed to the foramen above and below with a hockey-stick.  We had good freedom of motion of the L5 root as well as the dura.  Thoroughly irrigated out the area, loosely applied some thrombin-soaked Gelfoam and closed the wound layers in usual fashion.  I left a small distal deep and proximal part of the wound open for drainage purposes.  Before closing the subcu and skin, I injected 20 mL of Exparel into the wound site.  The skin and subcu was closed with #1 Vicryl.  The skin was closed with metal staples.  Sterile dressings were applied.  The patient left the operating room in satisfactory condition.          ______________________________ Georges Lynch Darrelyn Hillock, M.D.     RAG/MEDQ  D:  08/02/2013  T:  08/03/2013  Job:  [id number]

## 2013-08-08 NOTE — Progress Notes (Signed)
Physician Discharge Summary   Patient ID: Sean Hays MRN: [id number] DOB/AGE: Nov 01, 1950 62 y.o.  Admit date: 08/02/2013 Discharge date: 08/03/2013  Primary Diagnosis: Spinal stenosis, lumbar spine  Admission Diagnoses:  Past Medical History  Diagnosis Date  . Hypertension   . Diabetes mellitus without complication   . History of kidney stones     X1-litho for this  . H/O steroid therapy 07-27-13    recent Dosepak use -completed 3 days ago   Discharge Diagnoses:   Active Problems:   Spinal stenosis, lumbar region, with neurogenic claudication   Procedure:  Procedure(s) (LRB): LUMBAR LAMINECTOMY L4 - L5/MICRODISCECTOMY ON THE LEFT 1 LEVEL (Left)   Consults: None  HPI: Sean Hays presented with the chief complaint of back pain. He had a microdiscectomy and hemilaminectomy at L5-S1 to the right in 2008. He did extremely well until the beginning of September. 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 He also started experiencing back pain when coughing. Conservative measures including anti-inflammatories, muscle relaxers, and heat application were used but did not relieve the pain. MRI showed moderate to severe neuroforaminal narrowing secondary to a disc herniation that is foraminal at L4-L5 on the left which corresponds with his left LE pain. He has some numbness and tingling in the left LE but no motor deficits.     Laboratory Data: Admission on 08/02/2013, Discharged on 08/03/2013  Component Date Value Range Status  . ABO/RH(D) 08/02/2013 O NEG   Final  . Antibody Screen 08/02/2013 NEG   Final  . Sample Expiration 08/02/2013 08/05/2013   Final  . Glucose-Capillary 08/02/2013 112* 70 - 99 mg/dL Final  . Comment 1 40/98/1191 Documented in Chart   Final  . ABO/RH(D) 08/02/2013 O NEG   Final  . Glucose-Capillary 08/02/2013 137* 70 - 99 mg/dL Final  . Glucose-Capillary 08/02/2013 108* 70 - 99 mg/dL Final  . Glucose-Capillary 08/03/2013  150* 70 - 99 mg/dL Final  Hospital Outpatient Visit on 07/27/2013  Component Date Value Range Status  . WBC 07/27/2013 8.4  4.0 - 10.5 K/uL Final  . RBC 07/27/2013 5.07  4.22 - 5.81 MIL/uL Final  . Hemoglobin 07/27/2013 15.8  13.0 - 17.0 g/dL Final  . HCT 47/82/9562 43.5  39.0 - 52.0 % Final  . MCV 07/27/2013 85.8  78.0 - 100.0 fL Final  . MCH 07/27/2013 31.2  26.0 - 34.0 pg Final  . MCHC 07/27/2013 36.3* 30.0 - 36.0 g/dL Final  . RDW 13/04/6577 12.6  11.5 - 15.5 % Final  . Platelets 07/27/2013 197  150 - 400 K/uL Final  . Sodium 07/27/2013 138  135 - 145 mEq/L Final  . Potassium 07/27/2013 3.8  3.5 - 5.1 mEq/L Final  . Chloride 07/27/2013 100  96 - 112 mEq/L Final  . CO2 07/27/2013 29  19 - 32 mEq/L Final  . Glucose, Bld 07/27/2013 156* 70 - 99 mg/dL Final  . BUN 46/96/2952 21  6 - 23 mg/dL Final  . Creatinine, Ser 07/27/2013 1.04  0.50 - 1.35 mg/dL Final  . Calcium 84/13/2440 10.0  8.4 - 10.5 mg/dL Final  . Total Protein 07/27/2013 7.1  6.0 - 8.3 g/dL Final  . Albumin 07/19/2535 3.9  3.5 - 5.2 g/dL Final  . AST 64/40/3474 20  0 - 37 U/L Final  . ALT 07/27/2013 19  0 - 53 U/L Final  . Alkaline Phosphatase 07/27/2013 69  39 - 117 U/L Final  . Total Bilirubin 07/27/2013 0.5  0.3 - 1.2 mg/dL Final  . GFR calc non Af Amer 07/27/2013 74* >90 mL/min Final  . GFR calc Af Amer 07/27/2013 86* >90 mL/min Final   Comment: (NOTE)                          The eGFR has been calculated using the CKD EPI equation.                          This calculation has not been validated in all clinical situations.                          eGFR's persistently <90 mL/min signify possible Chronic Kidney                          Disease.  Marland Kitchen MRSA, PCR 07/27/2013 NEGATIVE  NEGATIVE Final  . Staphylococcus aureus 07/27/2013 NEGATIVE  NEGATIVE Final   Comment:                                 The Xpert SA Assay (FDA                          approved for NASAL specimens                          in patients over  73 years of age),                          is one component of                          a comprehensive surveillance                          program.  Test performance has                          been validated by [employer] for patients greater                          than or equal to 22 year old.                          It is not intended                          to diagnose infection nor to                          guide or monitor treatment.  . Prothrombin Time 07/27/2013 12.3  11.6 - 15.2 seconds Final  . INR 07/27/2013 0.93  0.00 - 1.49 Final  . Color, Urine 07/27/2013 YELLOW  YELLOW Final  . APPearance 07/27/2013 CLEAR  CLEAR Final  . Specific Gravity, Urine 07/27/2013 1.027  1.005 - 1.030 Final  . pH  07/27/2013 5.0  5.0 - 8.0 Final  . Glucose, UA 07/27/2013 250* NEGATIVE mg/dL Final  . Hgb urine dipstick 07/27/2013 NEGATIVE  NEGATIVE Final  . Bilirubin Urine 07/27/2013 NEGATIVE  NEGATIVE Final  . Ketones, ur 07/27/2013 NEGATIVE  NEGATIVE mg/dL Final  . Protein, ur 40/98/1191 NEGATIVE  NEGATIVE mg/dL Final  . Urobilinogen, UA 07/27/2013 0.2  0.0 - 1.0 mg/dL Final  . Nitrite 47/82/9562 NEGATIVE  NEGATIVE Final  . Leukocytes, UA 07/27/2013 NEGATIVE  NEGATIVE Final   MICROSCOPIC NOT DONE ON URINES WITH NEGATIVE PROTEIN, BLOOD, LEUKOCYTES, NITRITE, OR GLUCOSE <1000 mg/dL.  Marland Kitchen aPTT 07/27/2013 26  24 - 37 seconds Final     X-Rays:Dg Chest 2 View  07/27/2013   CLINICAL DATA:  Preop for lumbar surgery  EXAM: CHEST  2 VIEW  COMPARISON:  DG CHEST 2 VIEW dated 03/31/2007  FINDINGS: The heart size and mediastinal contours are within normal limits. Both lungs are clear. The visualized skeletal structures are unremarkable.  IMPRESSION: No acute cardiopulmonary process.   Electronically Signed   By: Genevive Bi M.D.   On: 07/27/2013 10:54   X-ray Lumbar Spine Ap And Lateral  07/27/2013   CLINICAL DATA:  Low back and left lower extremity pain  EXAM: LUMBAR SPINE  - 2-3 VIEW  COMPARISON:  04/01/2007  FINDINGS: There is no evidence of lumbar spine fracture. Alignment is normal. Mild narrowing of L3-4 and L4-5 interspaces. Small anterior endplate spurs L1-L4.  IMPRESSION: Mild disk narrowing L3-4 and L4-5. No acute bone abnormality.   Electronically Signed   By: Oley Balm M.D.   On: 07/27/2013 11:05   Dg Spine Portable 1 View  08/02/2013   CLINICAL DATA:  L4-5 level  EXAM: PORTABLE SPINE - 1 VIEW  COMPARISON:  Earlier today  FINDINGS: Surgical instrument has been advanced to the L4-5 disc space. Instrument is in the posterior 3rd of the disc space.  IMPRESSION: L4-5 disc space is localized.   Electronically Signed   By: Marlan Palau M.D.   On: 08/02/2013 17:52   Dg Spine Portable 1 View  08/02/2013   CLINICAL DATA:  Surgical level L4-5  EXAM: PORTABLE SPINE - 1 VIEW  COMPARISON:  Earlier today  FINDINGS: Two surgical and instruments have been advanced overlying the spinal canal at the L4-5 level.  IMPRESSION: L4-5 localized.   Electronically Signed   By: Marlan Palau M.D.   On: 08/02/2013 17:41   Dg Spine Portable 1 View  08/02/2013   CLINICAL DATA:  Intraoperative localization  EXAM: PORTABLE SPINE - 1 VIEW  COMPARISON:  07/27/2013  FINDINGS: Posterior surgical instruments are directed at the L4-5 disc space and overlying the at L4 spinous process.  IMPRESSION: Intraoperative localization as above.   Electronically Signed   By: Charlett Nose M.D.   On: 08/02/2013 16:58   Dg Spine Portable 1 View  08/02/2013   CLINICAL DATA:  L4-5  EXAM: PORTABLE SPINE - 1 VIEW  COMPARISON:  07/27/2013  FINDINGS: Posterior needles are directed at the L3-4 interspace and over the L4 spinous process.  IMPRESSION: Intraoperative localization as above.   Electronically Signed   By: Charlett Nose M.D.   On: 08/02/2013 16:49    EKG: Orders placed during the hospital encounter of 07/27/13  . EKG 12-LEAD  . EKG 12-LEAD  . EKG     Hospital Course: Sean Hays is a 62  y.o. who was admitted to [facility]. They were brought to the operating room on  08/02/2013 and underwent Procedure(s): LUMBAR LAMINECTOMY L4 - L5/MICRODISCECTOMY ON THE LEFT 1 LEVEL.  Patient tolerated the procedure well and was later transferred to the recovery room and then to the orthopaedic floor for postoperative care.  They were given PO and IV analgesics for pain control following their surgery.  They were given 24 hours of postoperative antibiotics of  Anti-infectives   Start     Dose/Rate Route Frequency Ordered Stop   08/03/13 0600  ceFAZolin (ANCEF) IVPB 2 g/50 mL premix     2 g 100 mL/hr over 30 Minutes Intravenous On call to O.R. 08/02/13 1242 08/02/13 1602   08/03/13 0000  ceFAZolin (ANCEF) IVPB 1 g/50 mL premix  Status:  Discontinued     1 g 100 mL/hr over 30 Minutes Intravenous 3 times per day 08/02/13 1950 08/03/13 1407   08/02/13 1642  polymyxin B 500,000 Units, bacitracin 50,000 Units in sodium chloride irrigation 0.9 % 500 mL irrigation  Status:  Discontinued       As needed 08/02/13 1642 08/02/13 1815     and started on DVT prophylaxis in the form of Aspirin.   PT was ordered.  Discharge planning consulted to help with postop disposition and equipment needs.  Patient had a fair night on the evening of surgery.  They started to get up OOB with therapy on day one.  Patient was seen in rounds and was ready to go home.   Discharge Medications: Prior to Admission medications   Medication Sig Start Date End Date Taking? Authorizing Provider  acetaminophen (TYLENOL) 500 MG tablet Take 1,000 mg by mouth every 6 (six) hours as needed for mild pain or moderate pain.   Yes Historical Provider, MD  atenolol (TENORMIN) 50 MG tablet Take 50 mg by mouth 2 (two) times daily.   Yes Historical Provider, MD  hydrochlorothiazide (HYDRODIURIL) 25 MG tablet Take 25 mg by mouth every morning.   Yes Historical Provider, MD  metFORMIN (GLUCOPHAGE) 500 MG tablet Take 1,000 mg by mouth 2  (two) times daily with a meal.   Yes Historical Provider, MD  pravastatin (PRAVACHOL) 20 MG tablet Take 20 mg by mouth every evening.   Yes Historical Provider, MD  aspirin EC 81 MG tablet Take 81 mg by mouth daily.    Historical Provider, MD  HYDROcodone-acetaminophen (NORCO/VICODIN) 5-325 MG per tablet Take 1-2 tablets by mouth every 4 (four) hours as needed for moderate pain. 08/03/13   Ghina Bittinger Tamala Ser, PA-C  methocarbamol (ROBAXIN) 500 MG tablet Take 1 tablet (500 mg total) by mouth every 6 (six) hours as needed for muscle spasms. 08/03/13   Thierry Dobosz Tamala Ser, PA-C    Diet: Diabetic diet Activity:WBAT Follow-up:in 2 weeks Disposition - Home Discharged Condition: stable   Discharge Orders   Future Orders Complete By Expires   Call MD / Call 911  As directed    Comments:     If you experience chest pain or shortness of breath, CALL 911 and be transported to the hospital emergency room.  If you develope a fever above 101 F, pus (white drainage) or increased drainage or redness at the wound, or calf pain, call your surgeon's office.   Constipation Prevention  As directed    Comments:     Drink plenty of fluids.  Prune juice may be helpful.  You may use a stool softener, such as Colace (over the counter) 100 mg twice a day.  Use MiraLax (over the counter) for constipation as needed.  Diet Carb Modified  As directed    Discharge instructions  As directed    Comments:     Change your dressing daily. Shower only, no tub bath. For the first few days, remove your dressing, tape a piece of saran wrap over your incision, take your shower, then remove the saran wrap and put a clean dressing on. After two days you can shower without the saran wrap.  Call Dr. Darrelyn Hillock if any wound complications or temperature of 101 degrees F or over.  Call the office for an appointment to see Dr. Darrelyn Hillock in two weeks: [phone] and ask for Dr. Jeannetta Ellis nurse, Mackey Birchwood.   Driving restrictions   As directed    Comments:     No driving for 2 weeks   Increase activity slowly as tolerated  As directed    Lifting restrictions  As directed    Comments:     No lifting       Medication List    STOP taking these medications       oxyCODONE-acetaminophen 10-325 MG per tablet  Commonly known as:  PERCOCET      TAKE these medications       acetaminophen 500 MG tablet  Commonly known as:  TYLENOL  Take 1,000 mg by mouth every 6 (six) hours as needed for mild pain or moderate pain.     aspirin EC 81 MG tablet  Take 81 mg by mouth daily.     atenolol 50 MG tablet  Commonly known as:  TENORMIN  Take 50 mg by mouth 2 (two) times daily.     hydrochlorothiazide 25 MG tablet  Commonly known as:  HYDRODIURIL  Take 25 mg by mouth every morning.     HYDROcodone-acetaminophen 5-325 MG per tablet  Commonly known as:  NORCO/VICODIN  Take 1-2 tablets by mouth every 4 (four) hours as needed for moderate pain.     metFORMIN 500 MG tablet  Commonly known as:  GLUCOPHAGE  Take 1,000 mg by mouth 2 (two) times daily with a meal.     methocarbamol 500 MG tablet  Commonly known as:  ROBAXIN  Take 1 tablet (500 mg total) by mouth every 6 (six) hours as needed for muscle spasms.     pravastatin 20 MG tablet  Commonly known as:  PRAVACHOL  Take 20 mg by mouth every evening.           Follow-up Information   Follow up with GIOFFRE,RONALD A, MD. Schedule an appointment as soon as possible for a visit in 2 weeks.   Specialty:  Orthopedic Surgery   Contact information:   [address] [place] [address] [phone]       Signed: Dimitri Ped [facility] 08/08/2013, 9:02 AM

## 2013-08-12 NOTE — Discharge Summary (Signed)
Physician Discharge Summary  Patient ID: Sean Hays MRN: [id number] DOB/AGE: 31-May-1951 62 y.o.  Admit date: 08/02/2013 Discharge date: 08/12/2013  Admission Diagnoses:Lumbar Spinal Stenosis  Discharge Diagnoses: Lumbar Spinal Stenosis Active Problems:   Spinal stenosis, lumbar region, with neurogenic claudication   Discharged Condition:Improved  Hospital Course: No Post-op complications  Consults: rehabilitation medicine  Significant Diagnostic Studies: Xrays in OR  Treatments: antibiotics: Ancef  Discharge Exam: Blood pressure 145/85, pulse 70, temperature 98.1 F (36.7 C), temperature source Oral, resp. rate 16, height 5\' 8"  (1.727 m), SpO2 99.00%. Extremities: extremities normal, atraumatic, no cyanosis or edema and Homans sign is negative, no sign of DVT  Disposition: 01-Home or Self Care  Discharge Orders   Future Orders Complete By Expires   Call MD / Call 911  As directed    Comments:     If you experience chest pain or shortness of breath, CALL 911 and be transported to the hospital emergency room.  If you develope a fever above 101 F, pus (white drainage) or increased drainage or redness at the wound, or calf pain, call your surgeon's office.   Constipation Prevention  As directed    Comments:     Drink plenty of fluids.  Prune juice may be helpful.  You may use a stool softener, such as Colace (over the counter) 100 mg twice a day.  Use MiraLax (over the counter) for constipation as needed.   Diet Carb Modified  As directed    Discharge instructions  As directed    Comments:     Change your dressing daily. Shower only, no tub bath. For the first few days, remove your dressing, tape a piece of saran wrap over your incision, take your shower, then remove the saran wrap and put a clean dressing on. After two days you can shower without the saran wrap.  Call Dr. Darrelyn Hillock if any wound complications or temperature of 101 degrees F or over.  Call the office for an  appointment to see Dr. Darrelyn Hillock in two weeks: [phone] and ask for Dr. Jeannetta Ellis nurse, Mackey Birchwood.   Driving restrictions  As directed    Comments:     No driving for 2 weeks   Increase activity slowly as tolerated  As directed    Lifting restrictions  As directed    Comments:     No lifting       Medication List    STOP taking these medications       oxyCODONE-acetaminophen 10-325 MG per tablet  Commonly known as:  PERCOCET      TAKE these medications       acetaminophen 500 MG tablet  Commonly known as:  TYLENOL  Take 1,000 mg by mouth every 6 (six) hours as needed for mild pain or moderate pain.     aspirin EC 81 MG tablet  Take 81 mg by mouth daily.     atenolol 50 MG tablet  Commonly known as:  TENORMIN  Take 50 mg by mouth 2 (two) times daily.     hydrochlorothiazide 25 MG tablet  Commonly known as:  HYDRODIURIL  Take 25 mg by mouth every morning.     HYDROcodone-acetaminophen 5-325 MG per tablet  Commonly known as:  NORCO/VICODIN  Take 1-2 tablets by mouth every 4 (four) hours as needed for moderate pain.     metFORMIN 500 MG tablet  Commonly known as:  GLUCOPHAGE  Take 1,000 mg by mouth 2 (two) times daily with a meal.  methocarbamol 500 MG tablet  Commonly known as:  ROBAXIN  Take 1 tablet (500 mg total) by mouth every 6 (six) hours as needed for muscle spasms.     pravastatin 20 MG tablet  Commonly known as:  PRAVACHOL  Take 20 mg by mouth every evening.           Follow-up Information   Follow up with Lavere Shinsky A, MD. Schedule an appointment as soon as possible for a visit in 2 weeks.   Specialty:  Orthopedic Surgery   Contact information:   [address] [place] [address] [phone]       Signed: Jacki Cones 08/12/2013, 8:37 AM

## 2013-08-29 ENCOUNTER — Observation Stay (HOSPITAL_COMMUNITY): Payer: 59 | Admitting: Registered Nurse

## 2013-08-29 ENCOUNTER — Encounter (HOSPITAL_COMMUNITY): Admission: AD | Disposition: A | Discharge status: Home / Self Care | Payer: Self-pay | Source: Ambulatory Visit

## 2013-08-29 ENCOUNTER — Observation Stay (HOSPITAL_COMMUNITY)
Admission: AD | Admit: 2013-08-29 | Discharge: 2013-08-30 | Disposition: A | Discharge status: Home / Self Care | Payer: 59 | Source: Ambulatory Visit | Attending: Surgery | Admitting: Surgery

## 2013-08-29 ENCOUNTER — Encounter (HOSPITAL_COMMUNITY): Payer: Self-pay | Admitting: Emergency Medicine

## 2013-08-29 ENCOUNTER — Emergency Department (HOSPITAL_COMMUNITY)
Admission: EM | Admit: 2013-08-29 | Discharge: 2013-08-29 | Disposition: A | Discharge status: Home / Self Care | Payer: 59 | Attending: Emergency Medicine | Admitting: Emergency Medicine

## 2013-08-29 ENCOUNTER — Encounter (HOSPITAL_COMMUNITY): Payer: 59 | Admitting: Registered Nurse

## 2013-08-29 ENCOUNTER — Encounter (INDEPENDENT_AMBULATORY_CARE_PROVIDER_SITE_OTHER): Payer: Self-pay | Admitting: General Surgery

## 2013-08-29 ENCOUNTER — Encounter (HOSPITAL_COMMUNITY): Payer: Self-pay | Admitting: *Deleted

## 2013-08-29 ENCOUNTER — Ambulatory Visit (INDEPENDENT_AMBULATORY_CARE_PROVIDER_SITE_OTHER): Payer: 59 | Admitting: General Surgery

## 2013-08-29 VITALS — BP 140/96 | HR 92 | Temp 98.9°F | Resp 15 | Ht 68.0 in | Wt 207.8 lb

## 2013-08-29 DIAGNOSIS — K642 Third degree hemorrhoids: Secondary | ICD-10-CM

## 2013-08-29 DIAGNOSIS — K644 Residual hemorrhoidal skin tags: Secondary | ICD-10-CM

## 2013-08-29 DIAGNOSIS — Z794 Long term (current) use of insulin: Secondary | ICD-10-CM | POA: Insufficient documentation

## 2013-08-29 DIAGNOSIS — K611 Rectal abscess: Secondary | ICD-10-CM | POA: Diagnosis present

## 2013-08-29 DIAGNOSIS — K612 Anorectal abscess: Secondary | ICD-10-CM

## 2013-08-29 DIAGNOSIS — Z87442 Personal history of urinary calculi: Secondary | ICD-10-CM | POA: Insufficient documentation

## 2013-08-29 DIAGNOSIS — K648 Other hemorrhoids: Secondary | ICD-10-CM

## 2013-08-29 DIAGNOSIS — I10 Essential (primary) hypertension: Secondary | ICD-10-CM | POA: Insufficient documentation

## 2013-08-29 DIAGNOSIS — K645 Perianal venous thrombosis: Secondary | ICD-10-CM | POA: Insufficient documentation

## 2013-08-29 DIAGNOSIS — E119 Type 2 diabetes mellitus without complications: Secondary | ICD-10-CM | POA: Insufficient documentation

## 2013-08-29 DIAGNOSIS — E785 Hyperlipidemia, unspecified: Secondary | ICD-10-CM | POA: Insufficient documentation

## 2013-08-29 DIAGNOSIS — R21 Rash and other nonspecific skin eruption: Secondary | ICD-10-CM | POA: Insufficient documentation

## 2013-08-29 HISTORY — PX: INCISION AND DRAINAGE PERIRECTAL ABSCESS: SHX1804

## 2013-08-29 HISTORY — DX: Other complications of anesthesia, initial encounter: T88.59XA

## 2013-08-29 HISTORY — DX: Adverse effect of unspecified anesthetic, initial encounter: T41.45XA

## 2013-08-29 LAB — BASIC METABOLIC PANEL
BUN: 30 mg/dL — ABNORMAL HIGH (ref 6–23)
Calcium: 10.1 mg/dL (ref 8.4–10.5)
Chloride: 94 mEq/L — ABNORMAL LOW (ref 96–112)
GFR calc Af Amer: >90 mL/min (ref >90)
GFR calc non Af Amer: 85 mL/min — ABNORMAL LOW (ref >90)
Potassium: 3.6 mEq/L (ref 3.5–5.1)
Sodium: 134 mEq/L — ABNORMAL LOW (ref 135–145)

## 2013-08-29 LAB — CBC
Hemoglobin: 16.3 g/dL (ref 13.0–17.0)
MCHC: 35.8 g/dL (ref 30.0–36.0)
RBC: 5.34 MIL/uL (ref 4.22–5.81)

## 2013-08-29 LAB — PROTIME-INR
INR: 0.99 (ref 0.00–1.49)
Prothrombin Time: 12.9 seconds (ref 11.6–15.2)

## 2013-08-29 LAB — GLUCOSE, CAPILLARY: Glucose-Capillary: 170 mg/dL — ABNORMAL HIGH (ref 70–99)

## 2013-08-29 LAB — SURGICAL PCR SCREEN: Staphylococcus aureus: NEGATIVE

## 2013-08-29 SURGERY — INCISION AND DRAINAGE, ABSCESS, PERIRECTAL
Anesthesia: General | Site: Rectum

## 2013-08-29 MED ORDER — LIDOCAINE HCL (CARDIAC) 20 MG/ML IV SOLN
INTRAVENOUS | Status: AC
  Filled 2013-08-29: qty 5

## 2013-08-29 MED ORDER — HYDROCORTISONE 2.5 % RE CREA: 1.0000 "application " | TOPICAL_CREAM | Freq: Two times a day (BID) | RECTAL | Status: DC

## 2013-08-29 MED ORDER — PROMETHAZINE HCL 25 MG/ML IJ SOLN: 6.2500 mg | INTRAMUSCULAR | Status: DC | PRN

## 2013-08-29 MED ORDER — HYDROCORTISONE ACETATE 25 MG RE SUPP: 25.0000 mg | Freq: Two times a day (BID) | RECTAL | Status: DC

## 2013-08-29 MED ORDER — HYDROMORPHONE HCL PF 2 MG/ML IJ SOLN
INTRAMUSCULAR | Status: AC
  Filled 2013-08-29: qty 1

## 2013-08-29 MED ORDER — FENTANYL CITRATE 0.05 MG/ML IJ SOLN
INTRAMUSCULAR | Status: DC | PRN
  Administered 2013-08-29 (×2): 100 ug via INTRAVENOUS
  Administered 2013-08-29: 50 ug via INTRAVENOUS

## 2013-08-29 MED ORDER — ATENOLOL 50 MG PO TABS
50.0000 mg | ORAL_TABLET | Freq: Two times a day (BID) | ORAL | Status: DC
Start: 2013-08-29 — End: 2013-08-30
  Administered 2013-08-29 – 2013-08-30 (×2): 50 mg via ORAL
  Filled 2013-08-29 (×4): qty 1

## 2013-08-29 MED ORDER — LACTATED RINGERS IV SOLN
INTRAVENOUS | Status: DC | PRN
  Administered 2013-08-29: 20:00:00 via INTRAVENOUS

## 2013-08-29 MED ORDER — SUCCINYLCHOLINE CHLORIDE 20 MG/ML IJ SOLN
INTRAMUSCULAR | Status: AC
  Filled 2013-08-29: qty 1

## 2013-08-29 MED ORDER — ONDANSETRON HCL 4 MG PO TABS
4.0000 mg | ORAL_TABLET | Freq: Four times a day (QID) | ORAL | Status: DC | PRN
  Filled 2013-08-29: qty 1

## 2013-08-29 MED ORDER — PREDNISONE 20 MG PO TABS: 40.0000 mg | ORAL_TABLET | Freq: Every day | ORAL | Status: DC

## 2013-08-29 MED ORDER — MIDAZOLAM HCL 2 MG/2ML IJ SOLN
INTRAMUSCULAR | Status: AC
  Filled 2013-08-29: qty 2

## 2013-08-29 MED ORDER — LABETALOL HCL 5 MG/ML IV SOLN
INTRAVENOUS | Status: AC
  Filled 2013-08-29: qty 4

## 2013-08-29 MED ORDER — PREDNISONE 20 MG PO TABS
40.0000 mg | ORAL_TABLET | Freq: Every day | ORAL | Status: DC
  Filled 2013-08-29 (×2): qty 2

## 2013-08-29 MED ORDER — FENTANYL CITRATE 0.05 MG/ML IJ SOLN
INTRAMUSCULAR | Status: AC
  Filled 2013-08-29: qty 5

## 2013-08-29 MED ORDER — BUPIVACAINE-EPINEPHRINE 0.25% -1:200000 IJ SOLN
INTRAMUSCULAR | Status: AC
  Filled 2013-08-29: qty 1

## 2013-08-29 MED ORDER — ROCURONIUM BROMIDE 100 MG/10ML IV SOLN
INTRAVENOUS | Status: AC
  Filled 2013-08-29: qty 1

## 2013-08-29 MED ORDER — ONDANSETRON HCL 4 MG/2ML IJ SOLN
INTRAMUSCULAR | Status: DC | PRN
  Administered 2013-08-29: 4 mg via INTRAVENOUS

## 2013-08-29 MED ORDER — 0.9 % SODIUM CHLORIDE (POUR BTL) OPTIME
TOPICAL | Status: DC | PRN
  Administered 2013-08-29: 1000 mL

## 2013-08-29 MED ORDER — HYDROCHLOROTHIAZIDE 25 MG PO TABS
25.0000 mg | ORAL_TABLET | Freq: Every morning | ORAL | Status: DC
  Administered 2013-08-30: 25 mg via ORAL
  Filled 2013-08-29 (×2): qty 1

## 2013-08-29 MED ORDER — DEXTROSE 5 % IV SOLN
2.0000 g | Freq: Four times a day (QID) | INTRAVENOUS | Status: DC
  Administered 2013-08-29 – 2013-08-30 (×5): 2 g via INTRAVENOUS
  Filled 2013-08-29 (×6): qty 2

## 2013-08-29 MED ORDER — DOCUSATE SODIUM 100 MG PO CAPS
100.0000 mg | ORAL_CAPSULE | Freq: Two times a day (BID) | ORAL | Status: DC
  Administered 2013-08-29 – 2013-08-30 (×2): 100 mg via ORAL
  Filled 2013-08-29 (×4): qty 1

## 2013-08-29 MED ORDER — LIDOCAINE HCL (CARDIAC) 20 MG/ML IV SOLN
INTRAVENOUS | Status: DC | PRN
  Administered 2013-08-29: 50 mg via INTRAVENOUS

## 2013-08-29 MED ORDER — METFORMIN HCL 500 MG PO TABS
1000.0000 mg | ORAL_TABLET | Freq: Two times a day (BID) | ORAL | Status: DC
  Administered 2013-08-30: 1000 mg via ORAL
  Filled 2013-08-29 (×3): qty 2

## 2013-08-29 MED ORDER — PROPOFOL 10 MG/ML IV BOLUS
INTRAVENOUS | Status: DC | PRN
  Administered 2013-08-29: 200 mg via INTRAVENOUS

## 2013-08-29 MED ORDER — HYDROMORPHONE HCL PF 1 MG/ML IJ SOLN
INTRAMUSCULAR | Status: DC | PRN
  Administered 2013-08-29 (×2): 1 mg via INTRAVENOUS

## 2013-08-29 MED ORDER — PROPOFOL 10 MG/ML IV BOLUS
INTRAVENOUS | Status: AC
  Filled 2013-08-29: qty 20

## 2013-08-29 MED ORDER — BUPIVACAINE LIPOSOME 1.3 % IJ SUSP
20.0000 mL | Freq: Once | INTRAMUSCULAR | Status: DC
  Filled 2013-08-29: qty 20

## 2013-08-29 MED ORDER — HEPARIN SODIUM (PORCINE) 5000 UNIT/ML IJ SOLN
5000.0000 [IU] | Freq: Three times a day (TID) | INTRAMUSCULAR | Status: DC
  Filled 2013-08-29: qty 1

## 2013-08-29 MED ORDER — POLYETHYLENE GLYCOL 3350 17 G PO PACK
17.0000 g | PACK | Freq: Every day | ORAL | Status: DC
  Administered 2013-08-30: 17 g via ORAL
  Filled 2013-08-29 (×2): qty 1

## 2013-08-29 MED ORDER — HEPARIN SODIUM (PORCINE) 5000 UNIT/ML IJ SOLN
5000.0000 [IU] | Freq: Three times a day (TID) | INTRAMUSCULAR | Status: DC
  Administered 2013-08-30: 5000 [IU] via SUBCUTANEOUS
  Filled 2013-08-29 (×4): qty 1

## 2013-08-29 MED ORDER — MORPHINE SULFATE 10 MG/ML IJ SOLN: 1.0000 mg | INTRAMUSCULAR | Status: DC | PRN

## 2013-08-29 MED ORDER — SODIUM CHLORIDE 0.9 % IV SOLN
INTRAVENOUS | Status: DC
  Administered 2013-08-29 (×2): via INTRAVENOUS

## 2013-08-29 MED ORDER — MIDAZOLAM HCL 5 MG/5ML IJ SOLN
INTRAMUSCULAR | Status: DC | PRN
  Administered 2013-08-29: 1 mg via INTRAVENOUS

## 2013-08-29 MED ORDER — MEPERIDINE HCL 50 MG/ML IJ SOLN: 6.2500 mg | INTRAMUSCULAR | Status: DC | PRN

## 2013-08-29 MED ORDER — ONDANSETRON HCL 4 MG/2ML IJ SOLN: 4.0000 mg | Freq: Four times a day (QID) | INTRAMUSCULAR | Status: DC | PRN

## 2013-08-29 MED ORDER — ROCURONIUM BROMIDE 100 MG/10ML IV SOLN
INTRAVENOUS | Status: DC | PRN
  Administered 2013-08-29: 5 mg via INTRAVENOUS

## 2013-08-29 MED ORDER — FENTANYL CITRATE 0.05 MG/ML IJ SOLN: 25.0000 ug | INTRAMUSCULAR | Status: DC | PRN

## 2013-08-29 MED ORDER — LACTATED RINGERS IV SOLN: INTRAVENOUS | Status: DC

## 2013-08-29 MED ORDER — HYALURONIDASE HUMAN 150 UNIT/ML IJ SOLN
INTRAMUSCULAR | Status: AC
  Filled 2013-08-29: qty 1

## 2013-08-29 MED ORDER — LABETALOL HCL 5 MG/ML IV SOLN
INTRAVENOUS | Status: DC | PRN
  Administered 2013-08-29 (×2): 2.5 mg via INTRAVENOUS
  Administered 2013-08-29: 5 mg via INTRAVENOUS

## 2013-08-29 MED ORDER — BUPIVACAINE-EPINEPHRINE 0.25% -1:200000 IJ SOLN
INTRAMUSCULAR | Status: DC | PRN
  Administered 2013-08-29: 5 mL

## 2013-08-29 MED ORDER — INSULIN ASPART 100 UNIT/ML ~~LOC~~ SOLN
0.0000 [IU] | Freq: Three times a day (TID) | SUBCUTANEOUS | Status: DC
  Administered 2013-08-30: 3 [IU] via SUBCUTANEOUS
  Administered 2013-08-30: 5 [IU] via SUBCUTANEOUS

## 2013-08-29 MED ORDER — OXYCODONE-ACETAMINOPHEN 5-325 MG PO TABS: 1.0000 | ORAL_TABLET | ORAL | Status: DC | PRN

## 2013-08-29 MED ORDER — ONDANSETRON HCL 4 MG/2ML IJ SOLN
INTRAMUSCULAR | Status: AC
  Filled 2013-08-29: qty 2

## 2013-08-29 SURGICAL SUPPLY — 42 items
BLADE EXTENDED COATED 6.5IN (ELECTRODE) IMPLANT
BLADE HEX COATED 2.75 (ELECTRODE) ×2 IMPLANT
BRIEF STRETCH FOR OB PAD LRG (UNDERPADS AND DIAPERS) ×1 IMPLANT
CATH MUSHROOM 16FR (CATHETERS) ×1 IMPLANT
CONT SPECI 4OZ STER CLIK (MISCELLANEOUS) ×1 IMPLANT
DECANTER SPIKE VIAL GLASS SM (MISCELLANEOUS) ×2 IMPLANT
DRAIN PENROSE 18X1/2 LTX STRL (DRAIN) ×1 IMPLANT
DRAPE LAPAROTOMY T 102X78X121 (DRAPES) ×2 IMPLANT
ELECT REM PT RETURN 9FT ADLT (ELECTROSURGICAL) ×2
ELECTRODE REM PT RTRN 9FT ADLT (ELECTROSURGICAL) ×1 IMPLANT
GAUZE SPONGE 4X4 16PLY XRAY LF (GAUZE/BANDAGES/DRESSINGS) ×2 IMPLANT
GLOVE BIOGEL M STRL SZ7.5 (GLOVE) IMPLANT
GLOVE BIOGEL PI IND STRL 7.0 (GLOVE) ×1 IMPLANT
GLOVE BIOGEL PI INDICATOR 7.0 (GLOVE) ×1
GOWN PREVENTION PLUS LG XLONG (DISPOSABLE) ×2 IMPLANT
GOWN STRL REIN XL XLG (GOWN DISPOSABLE) ×3 IMPLANT
KIT BASIN OR (CUSTOM PROCEDURE TRAY) ×1 IMPLANT
NDL HYPO 25X1 1.5 SAFETY (NEEDLE) ×1 IMPLANT
NDL SAFETY ECLIPSE 18X1.5 (NEEDLE) IMPLANT
NEEDLE HYPO 18GX1.5 SHARP (NEEDLE) ×2
NEEDLE HYPO 22GX1.5 SAFETY (NEEDLE) ×2 IMPLANT
NEEDLE HYPO 25X1 1.5 SAFETY (NEEDLE) ×2 IMPLANT
NS IRRIG 1000ML POUR BTL (IV SOLUTION) ×2 IMPLANT
PACK BASIC VI WITH GOWN DISP (CUSTOM PROCEDURE TRAY) ×2 IMPLANT
PAD ABD 8X10 STRL (GAUZE/BANDAGES/DRESSINGS) IMPLANT
PENCIL BUTTON HOLSTER BLD 10FT (ELECTRODE) ×2 IMPLANT
SHEARS HARMONIC 9CM CVD (BLADE) ×1 IMPLANT
SPONGE GAUZE 4X4 12PLY (GAUZE/BANDAGES/DRESSINGS) ×2 IMPLANT
SPONGE HEMORRHOID 8X3CM (HEMOSTASIS) ×1 IMPLANT
SPONGE SURGIFOAM ABS GEL 100 (HEMOSTASIS) IMPLANT
SPONGE SURGIFOAM ABS GEL 12-7 (HEMOSTASIS) IMPLANT
STAPLER PROXIMATE HCS (STAPLE) IMPLANT
STAPLER VISISTAT 35W (STAPLE) ×1 IMPLANT
SUT CHROMIC 3 0 SH 27 (SUTURE) ×1 IMPLANT
SUT ETHILON 2 0 PS N (SUTURE) ×1 IMPLANT
SUT PROLENE 2 0 BLUE (SUTURE) ×2 IMPLANT
SUT VIC AB 4-0 SH 18 (SUTURE) IMPLANT
SYR 20CC LL (SYRINGE) ×1 IMPLANT
SYR CONTROL 10ML LL (SYRINGE) ×2 IMPLANT
TAPE CLOTH 4X10 WHT NS (GAUZE/BANDAGES/DRESSINGS) IMPLANT
TOWEL OR 17X26 10 PK STRL BLUE (TOWEL DISPOSABLE) ×1 IMPLANT
YANKAUER SUCT BULB TIP 10FT TU (MISCELLANEOUS) ×2 IMPLANT

## 2013-08-29 NOTE — Transfer of Care (Signed)
Immediate Anesthesia Transfer of Care Note  Patient: Sean Hays  Procedure(s) Performed: Procedure(s): IRRIGATION AND DEBRIDEMENT LEFT  ISCHEAL RECTAL ABSCESS /EXAMINATION UNDER ANESTHESIA /EXCISION OF RIGHT NECROTIC PROLAPSED INTERNAL  HEMORRHOID (N/A)  Patient Location: PACU  Anesthesia Type:General  Level of Consciousness: awake, alert , oriented and patient cooperative  Airway & Oxygen Therapy: Patient Spontanous Breathing and Patient connected to face mask oxygen  Post-op Assessment: Report given to PACU RN, Post -op Vital signs reviewed and stable and Patient moving all extremities X 4  Post vital signs: stable  Complications: No apparent anesthesia complications

## 2013-08-29 NOTE — Interval H&P Note (Signed)
History and Physical Interval Note:  08/29/2013 7:41 PM  [name]  has presented today for surgery, with the diagnosis of Perirectal abscess [566]  The various methods of treatment have been discussed with the patient and family. After consideration of risks, benefits and other options for treatment, the patient has consented to  Procedure(s): IRRIGATION AND DEBRIDEMENT PERIRECTAL ABSCESS (N/A), EUA, hemorrhoidectomy as a surgical intervention .  The patient's history has been reviewed, patient examined, no change in status, stable for surgery.  I have reviewed the patient's chart and labs.  Questions were answered to the patient's satisfaction.    Pt seen and examined Chart reviewed  Perirectal abscess Grade 3, possibly grade 4 hemorrhoid  To OR for EUA, i&D of perirectal abscess, possible hemorrhoidectomy  I discussed the procedure in detail.   We discussed the risks and benefits of surgery including, but not limited to bleeding, infection, blood clot formation, anesthesia risk, urinary retention, hemorrhoid recurrence, injury to the sphincters resulting in incontinence, fistula formation, need for additional procedures and the rare possibility of anal canal narrowing. I explained that the likelihood of improvement of their symptoms is good  We discussed the typical postoperative course.  I stressed the importance of not becoming constipated after surgery.  The patient was encouraged to limit pain medication if possible as this increases the likelihood of becoming constipated. The patient was advised to take stool softners & drink 8-10 glasses of non-carbonated, non-alcoholic beverages per day and to eat a high fiber diet.  I also encouraged soaking in a water warm bath for 15 minutes at a time several times a day and after a bowel movement.  The patient was advised to take laxatives such as milk of magnesia or Miralax if no bowel movement 2 days after surgery.  The patient was advised to  expect some blood tinged drainage as well as some blood in their bowel movements.   Sean Hays. Andrey Campanile, MD, FACS General, Bariatric, & Minimally Invasive Surgery [facility] Surgery, [place]  [facility]

## 2013-08-29 NOTE — Brief Op Note (Signed)
08/29/2013  9:02 PM  PATIENT:  Sean Hays  62 y.o. male  PRE-OPERATIVE DIAGNOSIS:  Perirectal abscess, Grade 4 Rt necrotic prolapsed internal hemorrhoid  POST-OPERATIVE DIAGNOSIS:  Left anterior ischiorectal abscess; grade 4 right lateral necrotic prolapsed internal hemorrhoid; Right posterior grade 2 internal hemorrhoid  PROCEDURE:  Procedure(s): IRRIGATION AND DEBRIDEMENT LEFT  ISCHEAL RECTAL ABSCESS /EXAMINATION UNDER ANESTHESIA /EXCISION OF RIGHT NECROTIC PROLAPSED INTERNAL  HEMORRHOID (N/A)  SURGEON:  Surgeon(s) and Role:    * Atilano Ina, MD - Primary  PHYSICIAN ASSISTANT: none  ASSISTANTS: none   ANESTHESIA:   general  EBL:     BLOOD ADMINISTERED:none  DRAINS: Penrose drain in the left ischiorectal abscess cavity   LOCAL MEDICATIONS USED:  MARCAINE     SPECIMEN:  Source of Specimen:  Rt necrotic hemorrhoid; culture of Left anterior ischiorectal abscess   DISPOSITION OF SPECIMEN:  PATHOLOGY  COUNTS:  YES  TOURNIQUET:  * No tourniquets in log *  DICTATION: .Other Dictation: Dictation Number [id number]  PLAN OF CARE: Admit for overnight observation  PATIENT DISPOSITION:  PACU - hemodynamically stable.   Delay start of Pharmacological VTE agent (>24hrs) due to surgical blood loss or risk of bleeding: no  Mary Sella. Andrey Campanile, MD, FACS General, Bariatric, & Minimally Invasive Surgery [facility] Surgery, [place]

## 2013-08-29 NOTE — Progress Notes (Signed)
The patient comes in Of a history of perirectal pain and discomfort since Thanksgiving. He's been treated with various perianal creams. On examination today the patient has grade 3 prolapsed hemorrhoids along with a necrotic prolapsed hemorrhoid and a perirectal abscess at approximately the 7:00 position with 12:00 being directly posterior.  The patient is exquisitely tender. On digital examination does large amount of purulent drainage from the perirectal area. It seems to be more than what I should do in the office. It was suggested the patient be brought in for a procedure under anesthesia included an examination under anesthesia. I would talk to the surgeon on call at [facility].    The tongue blade is pointing to area of perirectal abscess with exquisite pain and pus extruded.

## 2013-08-29 NOTE — H&P (View-Only) (Signed)
Sean Hays is an 62 y.o. male.   Chief Complaint: Perianal and perirectal pain to HPI: The patient has been having difficulty since around Thanksgiving. He's tried various ointments solutions including Proctosol for this without any improvement. He now has excruciating pain and pus draining from his anus. He comes in for evaluation.  Past Medical History  Diagnosis Date  . Hypertension   . Diabetes mellitus without complication   . History of kidney stones     X1-litho for this  . H/O steroid therapy 07-27-13    recent Dosepak use -completed 3 days ago  . Hyperlipidemia     Past Surgical History  Procedure Laterality Date  . Back surgery      '08  . Lithotripsy    . Tonsillectomy    . Lumbar laminectomy/decompression microdiscectomy Left 08/02/2013    Procedure: LUMBAR LAMINECTOMY L4 - L5/MICRODISCECTOMY ON THE LEFT 1 LEVEL;  Surgeon: Ronald A Gioffre, MD;  Location: [facility];  Service: Orthopedics;  Laterality: Left;    History reviewed. No pertinent family history. Social History:  reports that he has never smoked. He has never used smokeless tobacco. He reports that he does not drink alcohol or use illicit drugs.  Allergies: No Known Allergies   (Not in a hospital admission)  No results found for this or any previous visit (from the past 48 hour(s)). No results found.  Review of Systems  Constitutional: Positive for fever and chills.  HENT: Negative.   Eyes: Negative.   Respiratory: Negative.   Cardiovascular: Negative.   Gastrointestinal: Negative.   Genitourinary: Negative.   Musculoskeletal: Negative.   Skin: Positive for rash (rosacea type rash all over his body).    Blood pressure 140/96, pulse 92, temperature 98.9 F (37.2 C), temperature source Temporal, resp. rate 15, height 5' 8" (1.727 m), weight 207 lb 12.8 oz (94.257 kg). Physical Exam  Constitutional: He appears well-developed and well-nourished.  HENT:  Head: Normocephalic and atraumatic.  Eyes:  EOM are normal. Pupils are equal, round, and reactive to light.  Neck: Normal range of motion.  Cardiovascular: Normal rate, regular rhythm and normal heart sounds.   Respiratory: Effort normal.  GI: Soft.  Genitourinary: Rectal exam shows external hemorrhoid, internal hemorrhoid, mass and tenderness.     Musculoskeletal: Normal range of motion.  Neurological: He is alert.  Skin: Skin is warm.  Psychiatric: He has a normal mood and affect. His behavior is normal. Judgment and thought content normal.     Assessment/Plan Grade 3 prolapsing internal hemorrhoid associated with a perirectal abscess and necrosis internal hemorrhoid  The patient is to be admitted to [facility] for general anesthesia and examination under anesthesia with drainage of a perirectal abscess and treatment of his grade 3 hemorrhoids  Dione Mccombie O 08/29/2013, 5:55 PM      

## 2013-08-29 NOTE — Anesthesia Preprocedure Evaluation (Addendum)
Anesthesia Evaluation  Patient identified by MRN, date of birth, ID band Patient awake    Reviewed: Allergy & Precautions, H&P , NPO status , Patient's Chart, lab work & pertinent test results  History of Anesthesia Complications Negative for: history of anesthetic complications  Airway Mallampati: II TM Distance: <3 FB Neck ROM: Full    Dental no notable dental hx.    Pulmonary neg pulmonary ROS,  breath sounds clear to auscultation  Pulmonary exam normal       Cardiovascular hypertension, Pt. on medications Rhythm:Regular Rate:Normal     Neuro/Psych negative neurological ROS  negative psych ROS   GI/Hepatic negative GI ROS, Neg liver ROS,   Endo/Other  diabetes, Type 2  Renal/GU negative Renal ROS  negative genitourinary   Musculoskeletal negative musculoskeletal ROS (+)   Abdominal   Peds negative pediatric ROS (+)  Hematology negative hematology ROS (+)   Anesthesia Other Findings   Reproductive/Obstetrics negative OB ROS                          Anesthesia Physical  Anesthesia Plan  ASA: II and emergent  Anesthesia Plan: General   Post-op Pain Management:    Induction: Intravenous  Airway Management Planned: Oral ETT  Additional Equipment:   Intra-op Plan:   Post-operative Plan: Extubation in OR  Informed Consent: I have reviewed the patients History and Physical, chart, labs and discussed the procedure including the risks, benefits and alternatives for the proposed anesthesia with the patient or authorized representative who has indicated his/her understanding and acceptance.   Dental advisory given  Plan Discussed with: CRNA and Surgeon  Anesthesia Plan Comments:        Anesthesia Quick Evaluation

## 2013-08-29 NOTE — ED Notes (Signed)
Per pt sts since the Friday after Thanksgiving he has had something in his rectum that is causing pain, hard to sit and this morning unable to have BM. sts hemmrhoids before but this feels different. Denies bleeding or drainage.

## 2013-08-29 NOTE — ED Provider Notes (Signed)
CSN: [id number]     Arrival date & time 08/29/13  0720 History   First MD Initiated Contact with Patient 08/29/13 [phone]     Chief Complaint  Patient presents with  . Rectal Pain   (Consider location/radiation/quality/duration/timing/severity/associated sxs/prior Treatment) HPI Comments: Pt came today because for the last 3 weeks he has had worsening pain and swelling in his rectum.  Went to urgent care over a week ago and told he had hemorrhoids.  He has been using proctosol cream without improvement and area is worsening.  States now feels more swollen too painful to sit and woke him up last night.  Today when he had to have a bowel movement he had to go in the shower first and lubricate his rectum to have a bowel movement.  Denies any bleeding, fever, rectal drainage.  No abd pain, N/V.  Patient is a 62 y.o. male presenting with rash. The history is provided by the patient.  Rash Location:  Full body Quality: redness and swelling   Severity:  Mild Onset quality:  Gradual Duration:  3 weeks Timing:  Intermittent Progression:  Improving Chronicity:  New Context comment:  Unclear what started it. occassionally will also get tongue swelling but none for over 1 week. Relieved by:  Antihistamines Associated symptoms: no abdominal pain, no nausea and not vomiting     Past Medical History  Diagnosis Date  . Hypertension   . Diabetes mellitus without complication   . History of kidney stones     X1-litho for this  . H/O steroid therapy 07-27-13    recent Dosepak use -completed 3 days ago   Past Surgical History  Procedure Laterality Date  . Back surgery      '08  . Lithotripsy    . Tonsillectomy    . Lumbar laminectomy/decompression microdiscectomy Left 08/02/2013    Procedure: LUMBAR LAMINECTOMY L4 - L5/MICRODISCECTOMY ON THE LEFT 1 LEVEL;  Surgeon: Jacki Cones, MD;  Location: [facility];  Service: Orthopedics;  Laterality: Left;   History reviewed. No pertinent family  history. History  Substance Use Topics  . Smoking status: Never Smoker   . Smokeless tobacco: Never Used  . Alcohol Use: No    Review of Systems  Gastrointestinal: Positive for rectal pain. Negative for nausea, vomiting, abdominal pain, blood in stool and anal bleeding.  Genitourinary: Negative for dysuria, urgency and frequency.  Skin: Positive for rash.  All other systems reviewed and are negative.    Allergies  Review of patient's allergies indicates no known allergies.  Home Medications   Current Outpatient Rx  Name  Route  Sig  Dispense  Refill  . acetaminophen (TYLENOL) 500 MG tablet   Oral   Take 1,000 mg by mouth every 6 (six) hours as needed for mild pain or moderate pain.         Marland Kitchen aspirin EC 81 MG tablet   Oral   Take 81 mg by mouth daily.         Marland Kitchen atenolol (TENORMIN) 50 MG tablet   Oral   Take 50 mg by mouth 2 (two) times daily.         . hydrochlorothiazide (HYDRODIURIL) 25 MG tablet   Oral   Take 25 mg by mouth every morning.         Marland Kitchen HYDROcodone-acetaminophen (NORCO/VICODIN) 5-325 MG per tablet   Oral   Take 1-2 tablets by mouth every 4 (four) hours as needed for moderate pain.   60 tablet  0   . metFORMIN (GLUCOPHAGE) 500 MG tablet   Oral   Take 1,000 mg by mouth 2 (two) times daily with a meal.         . methocarbamol (ROBAXIN) 500 MG tablet   Oral   Take 1 tablet (500 mg total) by mouth every 6 (six) hours as needed for muscle spasms.   40 tablet   1   . pravastatin (PRAVACHOL) 20 MG tablet   Oral   Take 20 mg by mouth every evening.          BP 162/102  Pulse 76  Temp(Src) 98 F (36.7 C) (Oral)  Resp 18  Ht 5\' 8"  (1.727 m)  Wt 215 lb (97.523 kg)  BMI 32.70 kg/m2  SpO2 98% Physical Exam  Nursing note and vitals reviewed. Constitutional: He is oriented to person, place, and time. He appears well-developed and well-nourished. No distress.  HENT:  Head: Normocephalic and atraumatic.  Mouth/Throat: Oropharynx is  clear and moist.  Eyes: Conjunctivae and EOM are normal. Pupils are equal, round, and reactive to light.  Neck: Normal range of motion. Neck supple.  Cardiovascular: Normal rate and regular rhythm.   Pulmonary/Chest: Effort normal. No respiratory distress.  Abdominal: Soft. He exhibits no distension. There is no tenderness. There is no guarding.  Genitourinary: Rectal exam shows external hemorrhoid, internal hemorrhoid and tenderness.     Musculoskeletal: Normal range of motion. He exhibits no edema and no tenderness.  Neurological: He is alert and oriented to person, place, and time.  Skin: Skin is warm and dry. Rash noted. No erythema.  Diffuse, sparse hives some on the arms and legs.  No tongue swelling  Psychiatric: He has a normal mood and affect. His behavior is normal.    ED Course  Procedures (including critical care time) Labs Review Labs Reviewed - No data to display Imaging Review No results found.  EKG Interpretation   None       MDM   1. Thrombosed external hemorrhoid   2. External hemorrhoids   3. Internal hemorrhoids      Patient presenting with sensation of a foreign object in his rectum. On exam he has numerous external hemorrhoids no significantly a large golf ball sized thrombosed hemorrhoid about 5:00 position. There no signs of infection and patient is still able to have bowel movements. Patient has been using Proctosol cream for the last 2 weeks without improvement. Given the extent and size of the hemorrhoid did not feel that this can be remedied in the ER and gave him followup with general surgery. The patient is to continue Proctosol cream and suppository.   Secondarily patient has had allergic reaction to an unknown source with intermittent hives and tongue swelling. This started approximately 2-3 weeks ago and was prior to starting any medication for the hemorrhoids. It improves with steroids at this time unclear what is causing this. Today he has  several diffuse small hives that he denies any itching. No tongue swelling at this time. He does not take an ACE inhibitor and states all of his other medication he's been on for years. Recommended following up with his PMD  Gwyneth Sprout, MD 08/29/13 [phone]

## 2013-08-29 NOTE — H&P (Addendum)
Sean Hays is an 62 y.o. male.   Chief Complaint: Perianal and perirectal pain to HPI: The patient has been having difficulty since around Thanksgiving. He's tried various ointments solutions including Proctosol for this without any improvement. He now has excruciating pain and pus draining from his anus. He comes in for evaluation.  Past Medical History  Diagnosis Date  . Hypertension   . Diabetes mellitus without complication   . History of kidney stones     X1-litho for this  . H/O steroid therapy 07-27-13    recent Dosepak use -completed 3 days ago  . Hyperlipidemia     Past Surgical History  Procedure Laterality Date  . Back surgery      '08  . Lithotripsy    . Tonsillectomy    . Lumbar laminectomy/decompression microdiscectomy Left 08/02/2013    Procedure: LUMBAR LAMINECTOMY L4 - L5/MICRODISCECTOMY ON THE LEFT 1 LEVEL;  Surgeon: Jacki Cones, MD;  Location: [facility];  Service: Orthopedics;  Laterality: Left;    History reviewed. No pertinent family history. Social History:  reports that he has never smoked. He has never used smokeless tobacco. He reports that he does not drink alcohol or use illicit drugs.  Allergies: No Known Allergies   (Not in a hospital admission)  No results found for this or any previous visit (from the past 48 hour(s)). No results found.  Review of Systems  Constitutional: Positive for fever and chills.  HENT: Negative.   Eyes: Negative.   Respiratory: Negative.   Cardiovascular: Negative.   Gastrointestinal: Negative.   Genitourinary: Negative.   Musculoskeletal: Negative.   Skin: Positive for rash (rosacea type rash all over his body).    Blood pressure 140/96, pulse 92, temperature 98.9 F (37.2 C), temperature source Temporal, resp. rate 15, height 5\' 8"  (1.727 m), weight 207 lb 12.8 oz (94.257 kg). Physical Exam  Constitutional: He appears well-developed and well-nourished.  HENT:  Head: Normocephalic and atraumatic.  Eyes:  EOM are normal. Pupils are equal, round, and reactive to light.  Neck: Normal range of motion.  Cardiovascular: Normal rate, regular rhythm and normal heart sounds.   Respiratory: Effort normal.  GI: Soft.  Genitourinary: Rectal exam shows external hemorrhoid, internal hemorrhoid, mass and tenderness.     Musculoskeletal: Normal range of motion.  Neurological: He is alert.  Skin: Skin is warm.  Psychiatric: He has a normal mood and affect. His behavior is normal. Judgment and thought content normal.     Assessment/Plan Grade 3 prolapsing internal hemorrhoid associated with a perirectal abscess and necrosis internal hemorrhoid  The patient is to be admitted to [facility] for general anesthesia and examination under anesthesia with drainage of a perirectal abscess and treatment of his grade 3 hemorrhoids  Dutchess Crosland O 08/29/2013, 5:55 PM

## 2013-08-30 ENCOUNTER — Encounter (HOSPITAL_COMMUNITY): Payer: Self-pay | Admitting: General Surgery

## 2013-08-30 LAB — HEMOGLOBIN A1C
Hgb A1c MFr Bld: 8 % — ABNORMAL HIGH (ref <5.7)
Mean Plasma Glucose: 183 mg/dL — ABNORMAL HIGH (ref <117)

## 2013-08-30 LAB — CBC
MCHC: 34.9 g/dL (ref 30.0–36.0)
Platelets: 222 10*3/uL (ref 150–400)
RDW: 12.9 % (ref 11.5–15.5)
WBC: 11.8 10*3/uL — ABNORMAL HIGH (ref 4.0–10.5)

## 2013-08-30 LAB — GLUCOSE, CAPILLARY: Glucose-Capillary: 141 mg/dL — ABNORMAL HIGH (ref 70–99)

## 2013-08-30 LAB — BASIC METABOLIC PANEL
Chloride: 96 mEq/L (ref 96–112)
GFR calc Af Amer: 89 mL/min — ABNORMAL LOW (ref >90)
GFR calc non Af Amer: 77 mL/min — ABNORMAL LOW (ref >90)
Potassium: 3.8 mEq/L (ref 3.5–5.1)
Sodium: 135 mEq/L (ref 135–145)

## 2013-08-30 MED ORDER — DIPHENHYDRAMINE HCL 25 MG PO CAPS
25.0000 mg | ORAL_CAPSULE | Freq: Four times a day (QID) | ORAL | Status: DC | PRN
  Administered 2013-08-30: 25 mg via ORAL
  Filled 2013-08-30: qty 1

## 2013-08-30 MED ORDER — DIPHENHYDRAMINE HCL 25 MG PO CAPS: 25.0000 mg | ORAL_CAPSULE | Freq: Four times a day (QID) | ORAL | Status: DC | PRN

## 2013-08-30 MED ORDER — AMOXICILLIN-POT CLAVULANATE 875-125 MG PO TABS
1.0000 | ORAL_TABLET | Freq: Two times a day (BID) | ORAL | Status: DC
  Filled 2013-08-30: qty 1

## 2013-08-30 MED ORDER — AMOXICILLIN-POT CLAVULANATE 875-125 MG PO TABS: 1.0000 | ORAL_TABLET | Freq: Two times a day (BID) | ORAL | Status: DC

## 2013-08-30 MED ORDER — OXYCODONE-ACETAMINOPHEN 5-325 MG PO TABS: 1.0000 | ORAL_TABLET | ORAL | Status: DC | PRN

## 2013-08-30 MED ORDER — DSS 100 MG PO CAPS: 100.0000 mg | ORAL_CAPSULE | Freq: Two times a day (BID) | ORAL | Status: DC

## 2013-08-30 MED ORDER — POLYETHYLENE GLYCOL 3350 17 G PO PACK: 17.0000 g | PACK | Freq: Every day | ORAL | Status: DC

## 2013-08-30 NOTE — Progress Notes (Signed)
Inpatient Diabetes Program Recommendations  AACE/ADA: New Consensus Statement on Inpatient Glycemic Control (2013)  Target Ranges:  Prepandial:   less than 140 mg/dL      Peak postprandial:   less than 180 mg/dL (1-2 hours)      Critically ill patients:  140 - 180 mg/dL   Reason for Visit: Results for Sean Hays, Sean Hays (MRN [id number]) as of 08/30/2013 08:41  Ref. Range 08/29/2013 18:31 08/30/2013 05:10  Glucose Latest Range: 70-99 mg/dL 784 (H) 696 (H)   Note history of diabetes.  Consider checking A1C to determine pre-hospitalization glycemic control.  If fasting CBG's remain greater than 150 mg/dL, consider adding Lantus 15 units daily while in the hospital to meet basal insulin needs.   Thanks, Beryl Meager, RN, BC-ADM Inpatient Diabetes Coordinator Pager [phone]

## 2013-08-30 NOTE — Progress Notes (Signed)
1 Day Post-Op  Subjective: He seems to be doing fine from surgery, rectum is very sore. He has some new skin rash sites pop up this Am around the blood draw site.  They are better with benadryl, he is not on daily steroids now.  He took last pill yesterday before he was seen at our office.  Objective: Vital signs in last 24 hours: Temp:  [97.7 F (36.5 C)-98.9 F (37.2 C)] 98.2 F (36.8 C) (12/09 0527) Pulse Rate:  [71-92] 73 (12/09 0527) Resp:  [14-21] 18 (12/09 0527) BP: (121-165)/(71-135) 136/71 mmHg (12/09 0527) SpO2:  [93 %-100 %] 100 % (12/09 0527) Weight:  [93.895 kg (207 lb)-94.257 kg (207 lb 12.8 oz)] 93.895 kg (207 lb) (12/08 1745) Last BM Date: 08/29/13 Afebrile, VSS Labs OK, WBC is better Cultures pending Intake/Output from previous day: 12/08 0701 - 12/09 0700 In: 900 [I.V.:900] Out: 350 [Urine:350] Intake/Output this shift:    General appearance: alert, cooperative and no distress Skin: Rectal site is clean, dresing changed and penrose drain is in place.  He has variable shaped sites of raised almost bullou like skin erruptions on his arms, and trunk.   Right arm antecubital      Left antecubital site, this came up after blood draw this Am    Left axilla Lab Results:   Recent Labs  08/29/13 1831 08/30/13 0510  WBC 14.6* 11.8*  HGB 16.3 14.0  HCT 45.5 40.1  PLT 321 222    BMET  Recent Labs  08/29/13 1831 08/30/13 0510  NA 134* 135  K 3.6 3.8  CL 94* 96  CO2 26 30  GLUCOSE 201* 179*  BUN 30* 25*  CREATININE 0.98 1.01  CALCIUM 10.1 9.1   PT/INR  Recent Labs  08/29/13 1831  LABPROT 12.9  INR 0.99    No results found for this basename: AST, ALT, ALKPHOS, BILITOT, PROT, ALBUMIN,  in the last 168 hours   Lipase  No results found for this basename: lipase     Studies/Results: No results found.  Medications: . atenolol  50 mg Oral BID  . cefOXitin  2 g Intravenous Q6H  . docusate sodium  100 mg Oral BID  . heparin  5,000  Units Subcutaneous Q8H  . hydrochlorothiazide  25 mg Oral q morning - 10a  . insulin aspart  0-15 Units Subcutaneous TID WC  . metFORMIN  1,000 mg Oral BID WC  . polyethylene glycol  17 g Oral Daily  . predniSONE  40 mg Oral Daily    Assessment/Plan Left anterior ischiorectal abscess; grade 4 right lateral necrotic prolapsed internal hemorrhoid; Right posterior grade 2 internal hemorrhoid   S/p IRRIGATION AND DEBRIDEMENT LEFT ISCHEAL RECTAL ABSCESS /EXAMINATION UNDER ANESTHESIA /EXCISION OF RIGHT NECROTIC PROLAPSED INTERNAL HEMORRHOID (N/A)  On steroid dose pack for skin rash  S/p laminectomy 08/02/13. Hypertension AODM Dyslipidemia    Plan:  I will get him up to sitz bath, and shower.  He is on colace and miralax.  He is no longer on daily steroids, but has some for oral swelling if it should occur again.  I will check and make sure he does not need to go home on antibiotics. I have recommended they return to PCP to discuss rash and possible dermatology consult.  Pharmacist is looking at his medicines.   LOS: 1 day    Tomasina Keasling 08/30/2013

## 2013-08-30 NOTE — Op Note (Signed)
Sean Hays, Sean Hays                ACCOUNT NO.:  [id number]  MEDICAL RECORD NO.:  [id number]  LOCATION:  1522                         FACILITY:  [facility]  PHYSICIAN:  Mary Sella. Andrey Campanile, MD, FACSDATE OF BIRTH:  April 16, 1951  DATE OF PROCEDURE:  08/29/2013 DATE OF DISCHARGE:  08/30/2013                              OPERATIVE REPORT   PREOPERATIVE DIAGNOSES: 1. Perirectal abscess. 2. Grade 4 right necrotic prolapsed internal hemorrhoid.  POSTOPERATIVE DIAGNOSES: 1. Left anterior ischiorectal abscess. 2. Grade 4 right lateral necrotic prolapsed internal hemorrhoid. 3. Right posterior grade 2 internal hemorrhoid.  PROCEDURE: 1. Exam under anesthesia. 2. Incision and drainage with debridement of left ischiorectal abscess     with placement of Penrose drain. 3. Excision of right necrotic prolapsed internal hemorrhoid     (hemorrhoidectomy.)  SURGEON:  Mary Sella. Andrey Campanile, MD, FACS  ANESTHESIA:  General.  ESTIMATED BLOOD LOSS:  Less than 50.  SPECIMEN: 1. Left anterior ischiorectal abscess fluid sent for culture. 2. Right lateral necrotic hemorrhoid.  INDICATIONS FOR PROCEDURE:  The patient is a gentleman who has been struggling with hemorrhoidal flare since Thanksgiving.  He had been in the hospital prior to Thanksgiving with lumbar surgery.  He had been having bowel movements  he said, but he strained 1 afternoon and had developed a severe hemorrhoidal flare.  He had tried numerous over-the- counter therapies and ended up in the emergency room last night and was referred to our office this afternoon for evaluation.  In the office, he was very tender on exam per Dr. Lindie Spruce.  It was evident that the patient probably had a perirectal abscess as well as at least a grade 3 prolapsed hemorrhoid that was possibly necrotic.  It was recommended that he will be brought to the operating room for evaluation under anesthesia, as well as the appropriate management.  I met the patient in the holding  area, and I had reviewed his chart and already discussed this case with Dr. Lindie Spruce.  The exam of his perineum was limited due to discomfort.  We discussed the risks and benefits of surgery including, but not limited to, bleeding, infection, blood clot formation, urinary retention, need for additional procedures, ongoing worsening infection requiring procedures, fistula formation, incontinence, anal canal stenosis, anesthesia concerns, pelvic sepsis.  I explained that it would take several weeks for the area to heal.  I explained that also that he could be at risk for developing a fistula as well.  I explained that we would not be able to take care of all the hemorrhoids tonight.  Our main goal was to drain the abscess and do hemorrhoidectomy of the necrotic prolapsed internal hemorrhoid.  He elected to proceed with surgery.  DESCRIPTION OF PROCEDURE:  The patient was taken to operating room 1 at [facility]  General endotracheal anesthesia was established. Sequential compression devices were placed.  He was then placed in the prone jackknife position with appropriate padding.  His buttocks were taped apart.  His perineum and buttocks and anus was prepped and draped with Betadine.  A surgical time-out was performed.  He received IV antibiotics prior to skin incision because of the concern for abscess. Surgical  time-out was performed.  Visual inspection revealed a boggy fluctuant area in the left anterior position.  He had circumferential engorged external hemorrhoidal tissue, but none necessarily thrombosed in the right lateral position, more so. Between the anterior and posterior position, he had a portion of a prolapsed internal hemorrhoid that was partially necrotic.  On further inspection use doing anoscopy, he had circumferential grade 2-3 internal hemorrhoids in the posterior midline and right posterolateral position. I aspirated the left anterior boggy area, was able to  get back serous bloody purulent fluid.  I was able to drain about 30 mL of fluid.  This was sent for culture.  I then made a cruciate incision with a #15 blade directly over the area of maximal fluctuance.  There was a large amount of purulent and blood-tinged drainage.  I was able to place my finger into the abscess cavity.  It tracked up along his anus and rectum. There was no drainage of pus within the anal or rectal canal.  I did not probe it any further.  Hemostasis was achieved with electrocautery as well as stuffing the defect with the gauze.  I then went about injecting some local along the anoderm, along the engorged external hemorrhoidal tissue in the right lateral position.  Then, I made a V-shaped incision with a fresh 15 blade.  Then, lifted the mucosa and submucosa away from the underlying sphincter, and then using Harmonic Scalpel, I elliptically excised the external and partially necrotic internal hemorrhoid in its entirety.  There was still some surrounding engorgement of internal hemorrhoidal tissue, but due to how the infection on the other side, I felt the prudent course of action was just to take care of the necrotic prolapsed hemorrhoid.  The hemorrhoidal pedicle had a little bit of ooze, therefore, I placed a figure-of-eight 2-0 chromic suture at its base.  The packing that had been placed in the left anterior abscess cavity was removed.  I then placed a quarter-inch Penrose drain up into the abscess cavity to secure trended and secured the tail of the Penrose to the perirectal skin with a 2-0 nylon.  A piece of Gelfoam was placed in the rectal and the anal wall.  There was adequate hemostasis appeared to be achieved.  The patient was placed in a supine position, extubated and taken to recovery room in stable condition.  There were no immediate complications.  The patient tolerated the procedure well.     Mary Sella. Andrey Campanile, MD, FACS     EMW/MEDQ  D:   08/29/2013  T:  08/30/2013  Job:  [id number]

## 2013-08-30 NOTE — Progress Notes (Signed)
Pt complaints of itchy, hive-like allergic reactions scattered all around his body which started about two weeks ago; he requested medicine for itching. Dr. Andrey Campanile paged and notified of pt's condition and gave order for Benadryl PO Q6H PRN. Will continue to monitor.

## 2013-08-30 NOTE — Discharge Summary (Signed)
Physician Discharge Summary  Patient ID: Sean Hays MRN: [id number] DOB/AGE: 06/21/1951 62 y.o.  Admit date: 08/29/2013 Discharge date: 08/30/2013  Admission Diagnoses:  Grade 3 prolapsing internal hemorrhoid associated with a perirectal abscess and necrosis internal hemorrhoid   Discharge Diagnoses:  Left anterior ischiorectal abscess; grade 4 right lateral necrotic prolapsed internal hemorrhoid; Right posterior grade 2 internal hemorrhoid  Active Problems:   Perirectal abscess   Prolapsed internal hemorrhoids, grade 3 On steroid dose pack for skin rash  S/p laminectomy 08/02/13.  Hypertension  AODM Dyslipidemia    PROCEDURES: S/p IRRIGATION AND DEBRIDEMENT LEFT ISCHEAL RECTAL ABSCESS /EXAMINATION UNDER ANESTHESIA /EXCISION OF RIGHT NECROTIC PROLAPSED INTERNAL HEMORRHOID 08/30/13, Dr. Gaynelle Adu.     Hospital Course: The patient has been having difficulty since around Thanksgiving. He's tried various ointments solutions including Proctosol for this without any improvement. He now has excruciating pain and pus draining from his anus. He comes in for evaluation. He was seen by our Urgent office and sent to the hospital and Dr. Andrey Campanile for surgery.  He has done will with surgery, labs show improving WBC, elevated glucose.  He reports having trouble with his glucose since laminectomy on 08/02/13. He has a very unusual rash that has been comes and goes, starting about 1 week after his laminectomy.  I have some pictures in the record.  The left side started early this AM after his blood was drawn. By discharge it is much better. Sean Hays seems to work.  He is off steroids, but has a backup plan if he has recurrent facial swelling. i have referred him back to PCP for diabetes control and rash evaluation. We have talked about maintaining stool, and being sure it is soft.  Follow up for AODM and tight control.  Follow up of rash by PCP.  HE will see Dr. Andrey Campanile in the office next  week. Condition on D/C:  Improved        Disposition: 01-Home or Self Care  Discharge Orders   Future Appointments Provider Department Dept Phone   09/08/2013 2:15 PM Atilano Ina, MD [facility], [place] [phone]   Future Orders Complete By Expires   Care order/instruction  As directed    Scheduling Instructions:     You can shower and clean this area as often as you need to.  Sitz bath is simply sitting in warm water that helps this area.  You can use your hemorrhoid preparations if your hemorrhoids are an issue.       Medication List         acetaminophen 500 MG tablet  Commonly known as:  TYLENOL  Take 1,000 mg by mouth every 6 (six) hours as needed for mild pain or moderate pain.     amoxicillin-clavulanate 875-125 MG per tablet  Commonly known as:  AUGMENTIN  Take 1 tablet by mouth every 12 (twelve) hours.     aspirin EC 81 MG tablet  Take 81 mg by mouth daily.     atenolol 50 MG tablet  Commonly known as:  TENORMIN  Take 50 mg by mouth 2 (two) times daily.     diphenhydrAMINE 25 mg capsule  Commonly known as:  Sean Hays  Take 1 capsule (25 mg total) by mouth every 6 (six) hours as needed for itching.     DSS 100 MG Caps  Take 100 mg by mouth 2 (two) times daily.     hydrochlorothiazide 25 MG tablet  Commonly known as:  HYDRODIURIL  Take  25 mg by mouth every morning.     hydrocortisone 2.5 % rectal cream  Commonly known as:  PROCTOSOL HC  Place 1 application rectally 2 (two) times daily.     hydrocortisone 25 MG suppository  Commonly known as:  ANUCORT-HC  Place 1 suppository (25 mg total) rectally 2 (two) times daily.     metFORMIN 500 MG tablet  Commonly known as:  GLUCOPHAGE  Take 1,000 mg by mouth 2 (two) times daily with a meal.     oxyCODONE-acetaminophen 5-325 MG per tablet  Commonly known as:  PERCOCET/ROXICET  Take 1-2 tablets by mouth every 4 (four) hours as needed for moderate pain.     polyethylene glycol packet   Commonly known as:  MIRALAX / GLYCOLAX  Take 17 g by mouth daily.     pravastatin 20 MG tablet  Commonly known as:  PRAVACHOL  Take 20 mg by mouth every evening.     predniSONE 20 MG tablet  Commonly known as:  DELTASONE  Take 0-40 mg by mouth daily as needed. Ordered as short course taper as needed x 3 days per MD instruction for mouth swelling.  Last dose 12/8.           Follow-up Information   Follow up with Atilano Ina, MD On 09/08/2013. (Your appointment is at 2:15 PM, be there 20 minutes early for check in.  )    Specialty:  General Surgery   Contact information:   [address] [place] [address] [phone]       Follow up with Thora Lance, MD. (Ask for some help with glucose control, and rash.)    Specialty:  Family Medicine   Contact information:   [address], [address] [place] [address] [phone]       Signed: Sherrie George 08/30/2013, 12:11 PM

## 2013-09-01 NOTE — Anesthesia Postprocedure Evaluation (Signed)
  Anesthesia Post-op Note  Patient: Sean Hays  Procedure(s) Performed: Procedure(s) (LRB): IRRIGATION AND DEBRIDEMENT LEFT  ISCHEAL RECTAL ABSCESS /EXAMINATION UNDER ANESTHESIA /EXCISION OF RIGHT NECROTIC PROLAPSED INTERNAL  HEMORRHOID (N/A)  Patient Location: PACU  Anesthesia Type: General  Level of Consciousness: awake and alert   Airway and Oxygen Therapy: Patient Spontanous Breathing  Post-op Pain: mild  Post-op Assessment: Post-op Vital signs reviewed, Patient's Cardiovascular Status Stable, Respiratory Function Stable, Patent Airway and No signs of Nausea or vomiting  Last Vitals:  Filed Vitals:   08/30/13 1000  BP: 150/77  Pulse: 83  Temp: 36.8 C  Resp: 20    Post-op Vital Signs: stable   Complications: No apparent anesthesia complications

## 2013-09-02 LAB — CULTURE, ROUTINE-ABSCESS

## 2013-09-05 LAB — ANAEROBIC CULTURE

## 2013-09-08 ENCOUNTER — Ambulatory Visit (INDEPENDENT_AMBULATORY_CARE_PROVIDER_SITE_OTHER): Payer: 59 | Admitting: General Surgery

## 2013-09-08 ENCOUNTER — Encounter (INDEPENDENT_AMBULATORY_CARE_PROVIDER_SITE_OTHER): Payer: Self-pay | Admitting: General Surgery

## 2013-09-08 ENCOUNTER — Encounter (INDEPENDENT_AMBULATORY_CARE_PROVIDER_SITE_OTHER): Payer: Self-pay

## 2013-09-08 VITALS — BP 126/76 | HR 76 | Temp 98.0°F | Resp 18 | Ht 68.0 in | Wt 203.0 lb

## 2013-09-08 DIAGNOSIS — Z09 Encounter for follow-up examination after completed treatment for conditions other than malignant neoplasm: Secondary | ICD-10-CM

## 2013-09-08 MED ORDER — SULFAMETHOXAZOLE-TMP DS 800-160 MG PO TABS: 1.0000 | ORAL_TABLET | Freq: Two times a day (BID) | ORAL | Status: DC

## 2013-09-08 NOTE — Progress Notes (Signed)
Subjective:     Patient ID: Sean Hays, male   DOB: [birth year], 62 y.o.   MRN: [id number]  HPI  62 year old comes in for postoperative appointment after undergoing urgent exam under anesthesia, excision of a right necrotic prolapsed internal hemorrhoid, incision and drainage of a left anterior shin rectal abscess on December 8. He was discharged home from the hospital on December 9. He states that he is doing great. He denies any fevers or chills. He denies any difficulty urinating. He denies any nausea, vomiting, diarrhea or constipation. He has seen his orthopedic surgeon who continued his antibiotics. He states that his orthopedic surgeon didn't want the infection to potentially spread up his spine. He is currently taking Colace twice a day. He is also taking MiraLAX daily. He is also doing sitz baths. He is also having some daily watery bloody drainage from the incision  Review of Systems     Objective:   Physical Exam BP 126/76  Pulse 76  Temp(Src) 98 F (36.7 C)  Resp 18  Ht 5\' 8"  (1.727 m)  Wt 203 lb (92.08 kg)  BMI 30.87 kg/m2 Alert, no apparent distress Rectal-visual inspection reveals a cruciate incision in the left posterior lateral perianal skin. There is some fibrinous exudate. The Penrose drain is partially out. The right perianal skin is healing slowly. There is some serous exudate along the open mucosal area. There is no fluctuance or induration. He has essentially what appears to be a ring of cellulitis on both buttocks. The width of the ranges around 1 cm and appears almost like if he  Has sat on something for prolonged period of time    Assessment:     Status post exam under anesthesia, incision and drainage of left anterior extra rectal abscess, excision of necrotic right prolapsed internal hemorrhoid     Plan:     I'm very please with how he is doing. There is no significant sign of ongoing sign of infection. Because of his comorbidities and recent spinal surgery I  agree with a few more days of antibiotic therapy. His cultures grew MRSA. I sent in a prescription electronically for Bactrim.   I removed the Penrose drain in the suture. I advised him to continue with the sitz baths. We discussed the importance of fiber supplementation. We discussed the importance of slowly increase fiber supplementation in order to avoid bloating and cramping. I advised him that as his fiber intake increases he can cut back on the MiraLAX.  Followup 8 weeks  Mary Sella. Andrey Campanile, MD, FACS General, Bariatric, & Minimally Invasive Surgery [facility], [place]

## 2013-09-08 NOTE — Patient Instructions (Signed)
Pick up antibiotic  GETTING TO GOOD BOWEL HEALTH. Irregular bowel habits such as constipation and diarrhea can lead to many problems over time.  Having one soft bowel movement a day is the most important way to prevent further problems.  The anorectal canal is designed to handle stretching and feces to safely manage our ability to get rid of solid waste (feces, poop, stool) out of our body.  BUT, hard constipated stools can act like ripping concrete bricks and diarrhea can be a burning fire to this very sensitive area of our body, causing inflamed hemorrhoids, anal fissures, increasing risk is perirectal abscesses, abdominal pain/bloating, an making irritable bowel worse.     The goal: ONE SOFT BOWEL MOVEMENT A DAY!  To have soft, regular bowel movements:    Drink at least 8 tall glasses of water a day.     Take plenty of fiber.  Fiber is the undigested part of plant food that passes into the colon, acting s "natures broom" to encourage bowel motility and movement.  Fiber can absorb and hold large amounts of water. This results in a larger, bulkier stool, which is soft and easier to pass. Work gradually over several weeks up to 6 servings a day of fiber (25g a day even more if needed) in the form of: o Vegetables -- Root (potatoes, carrots, turnips), leafy green (lettuce, salad greens, celery, spinach), or cooked high residue (cabbage, broccoli, etc) o Fruit -- Fresh (unpeeled skin & pulp), Dried (prunes, apricots, cherries, etc ),  or stewed ( applesauce)  o Whole grain breads, pasta, etc (whole wheat)  o Bran cereals    Bulking Agents -- This type of water-retaining fiber generally is easily obtained each day by one of the following:  o Psyllium bran -- The psyllium plant is remarkable because its ground seeds can retain so much water. This product is available as Metamucil, Konsyl, Effersyllium, Per Diem Fiber, or the less expensive generic preparation in drug and health food stores. Although labeled  a laxative, it really is not a laxative.  o Methylcellulose -- This is another fiber derived from wood which also retains water. It is available as Citrucel. o  Benefiber o Polyethylene Glycol - and "artificial" fiber commonly called Miralax or Glycolax.  It is helpful for people with gassy or bloated feelings with regular fiber o Flax Seed - a less gassy fiber than psyllium   No reading or other relaxing activity while on the toilet. If bowel movements take longer than 5 minutes, you are too constipated   AVOID CONSTIPATION.  High fiber and water intake usually takes care of this.  Sometimes a laxative is needed to stimulate more frequent bowel movements, but    Laxatives are not a good long-term solution as it can wear the colon out. o Osmotics (Milk of Magnesia, Fleets phosphosoda, Magnesium citrate, MiraLax, GoLytely) are safer than  o Stimulants (Senokot, Castor Oil, Dulcolax, Ex Lax)    o Do not take laxatives for more than 7days in a row.    IF SEVERELY CONSTIPATED, try a Bowel Retraining Program: o Do not use laxatives.  o Eat a diet high in roughage, such as bran cereals and leafy vegetables.  o Drink six (6) ounces of prune or apricot juice each morning.  o Eat two (2) large servings of stewed fruit each day.  o Take one (1) heaping tablespoon of a psyllium-based bulking agent twice a day. Use sugar-free sweetener when possible to avoid excessive calories.  o Eat a normal breakfast.  o Set aside 15 minutes after breakfast to sit on the toilet, but do not strain to have a bowel movement.  o If you do not have a bowel movement by the third day, use an enema and repeat the above steps.   Continued to soak in warm water tub twice a day. Continue to take MiraLAX as needed to avoid constipation Use wet wipes are moist toilet Paper to clean yourself Use minimal soap around bottom. If must use soap use Dove soap

## 2013-11-02 ENCOUNTER — Ambulatory Visit (INDEPENDENT_AMBULATORY_CARE_PROVIDER_SITE_OTHER): Payer: 59 | Admitting: General Surgery

## 2013-11-02 ENCOUNTER — Encounter (INDEPENDENT_AMBULATORY_CARE_PROVIDER_SITE_OTHER): Payer: Self-pay | Admitting: General Surgery

## 2013-11-02 VITALS — BP 138/80 | HR 72 | Temp 97.9°F | Resp 14 | Ht 68.0 in | Wt 211.0 lb

## 2013-11-02 DIAGNOSIS — K648 Other hemorrhoids: Secondary | ICD-10-CM

## 2013-11-02 NOTE — Patient Instructions (Signed)
GETTING TO GOOD BOWEL HEALTH. Irregular bowel habits such as constipation and diarrhea can lead to many problems over time.  Having one soft bowel movement a day is the most important way to prevent further problems.  The anorectal canal is designed to handle stretching and feces to safely manage our ability to get rid of solid waste (feces, poop, stool) out of our body.  BUT, hard constipated stools can act like ripping concrete bricks and diarrhea can be a burning fire to this very sensitive area of our body, causing inflamed hemorrhoids, anal fissures, increasing risk is perirectal abscesses, abdominal pain/bloating, an making irritable bowel worse.     The goal: ONE SOFT BOWEL MOVEMENT A DAY!  To have soft, regular bowel movements:    Drink at least 8 tall glasses of water a day.     Take plenty of fiber.  Fiber is the undigested part of plant food that passes into the colon, acting s "natures broom" to encourage bowel motility and movement.  Fiber can absorb and hold large amounts of water. This results in a larger, bulkier stool, which is soft and easier to pass. Work gradually over several weeks up to 6 servings a day of fiber (25g a day even more if needed) in the form of: o Vegetables -- Root (potatoes, carrots, turnips), leafy green (lettuce, salad greens, celery, spinach), or cooked high residue (cabbage, broccoli, etc) o Fruit -- Fresh (unpeeled skin & pulp), Dried (prunes, apricots, cherries, etc ),  or stewed ( applesauce)  o Whole grain breads, pasta, etc (whole wheat)  o Bran cereals    Bulking Agents -- This type of water-retaining fiber generally is easily obtained each day by one of the following:  o Psyllium bran -- The psyllium plant is remarkable because its ground seeds can retain so much water. This product is available as Metamucil, Konsyl, Effersyllium, Per Diem Fiber, or the less expensive generic preparation in drug and health food stores. Although labeled a laxative, it really  is not a laxative.  o Methylcellulose -- This is another fiber derived from wood which also retains water. It is available as Citrucel. o Benefiber o Polyethylene Glycol - and "artificial" fiber commonly called Miralax or Glycolax.  It is helpful for people with gassy or bloated feelings with regular fiber o Flax Seed - a less gassy fiber than psyllium   No reading or other relaxing activity while on the toilet. If bowel movements take longer than 5 minutes, you are too constipated   AVOID CONSTIPATION.  High fiber and water intake usually takes care of this.  Sometimes a laxative is needed to stimulate more frequent bowel movements, but    Laxatives are not a good long-term solution as it can wear the colon out. o Osmotics (Milk of Magnesia, Fleets phosphosoda, Magnesium citrate, MiraLax, GoLytely) are safer than  o Stimulants (Senokot, Castor Oil, Dulcolax, Ex Lax)    o Do not take laxatives for more than 7days in a row.    IF SEVERELY CONSTIPATED, try a Bowel Retraining Program: o Do not use laxatives.  o Eat a diet high in roughage, such as bran cereals and leafy vegetables.  o Drink six (6) ounces of prune or apricot juice each morning.  o Eat two (2) large servings of stewed fruit each day.  o Take one (1) heaping tablespoon of a psyllium-based bulking agent twice a day. Use sugar-free sweetener when possible to avoid excessive calories.  o Eat a normal breakfast.  o Set aside 15 minutes after breakfast to sit on the toilet, but do not strain to have a bowel movement.  o If you do not have a bowel movement by the third day, use an enema and repeat the above steps.    Controlling diarrhea o Switch to liquids and simpler foods for a few days to avoid stressing your intestines further. o Avoid dairy products (especially milk & ice cream) for a short time.  The intestines often can lose the ability to digest lactose when stressed. o Avoid foods that cause gassiness or bloating.  Typical  foods include beans and other legumes, cabbage, broccoli, and dairy foods.  Every person has some sensitivity to other foods, so listen to our body and avoid those foods that trigger problems for you. o Adding fiber (Citrucel, Metamucil, psyllium, Miralax) gradually can help thicken stools by absorbing excess fluid and retrain the intestines to act more normally.  Slowly increase the dose over a few weeks.  Too much fiber too soon can backfire and cause cramping & bloating. o Probiotics (such as active yogurt, Align, etc) may help repopulate the intestines and colon with normal bacteria and calm down a sensitive digestive tract.  Most studies show it to be of mild help, though, and such products can be costly. o Medicines:   Bismuth subsalicylate (ex. Kayopectate, Pepto Bismol) every 30 minutes for up to 6 doses can help control diarrhea.  Avoid if pregnant.   Loperamide (Immodium) can slow down diarrhea.  Start with two tablets (4mg  total) first and then try one tablet every 6 hours.  Avoid if you are having fevers or severe pain.  If you are not better or start feeling worse, stop all medicines and call your doctor for advice o Call your doctor if you are getting worse or not better.  Sometimes further testing (cultures, endoscopy, X-ray studies, bloodwork, etc) may be needed to help diagnose and treat the cause of the diarrhea. o

## 2013-11-02 NOTE — Progress Notes (Signed)
Subjective:     Patient ID: Sean Hays, male   DOB: Jul 08, 1951, 63 y.o.   MRN: [id number]  HPI 63 year old male comes in for followup after undergoing exam under anesthesia, excision of necrotic prolapsed right internal hemorrhoids, incision and drainage of a left anterior ischiorectal abscess in December 2014. I last saw him in December. He states he is doing well. He denies any pain with defecation. He denies any drainage or bleeding. He denies any difficulty urinating. He is not reading on the commode. He is no longer taking Benefiber. His thinking about 3 glasses of water a day. He states that he felt the Benefiber made him have diarrhea  Review of Systems     Objective:   Physical Exam BP 138/80  Pulse 72  Temp(Src) 97.9 F (36.6 C) (Oral)  Resp 14  Ht 5\' 8"  (1.727 m)  Wt 211 lb (95.709 kg)  BMI 32.09 kg/m2 Alert, nad Rectal - well healed left anterior cruciate incision. Almost healed Rt posterolateral anoderm. Good tone. Rt ant Grade 1 int hem and Left lat grade int hem    Assessment:     Status post exam under anesthesia, excision of necrotic prolapsed right internal hemorrhoids, incision and drainage of a left anterior ischio- rectal abscess     Plan:     Overall he looks good. There is no sign of fistula formation. We discussed important bowel habits. I encouraged him to resume a high fiber diet and increase his water intake to around 6 glasses of water. His have some residual internal hemorrhoids however he is asymptomatic. I recommended that he resume a high-fiber diet and hopefully the size of hemorrhoids will decrease. Followup as needed  Leighton Ruff. Redmond Pulling, MD, FACS General, Bariatric, & Minimally Invasive Surgery [facility], [place]

## 2013-11-15 ENCOUNTER — Encounter (INDEPENDENT_AMBULATORY_CARE_PROVIDER_SITE_OTHER): Payer: 59 | Admitting: General Surgery

## 2014-02-02 IMAGING — CR DG SPINE 1V PORT
1 series · 1 of 1 positions shown · non-contrast
Comparison: Earlier today

CLINICAL DATA: Surgical level L4-5

EXAM:
PORTABLE SPINE - 1 VIEW

[lateral]
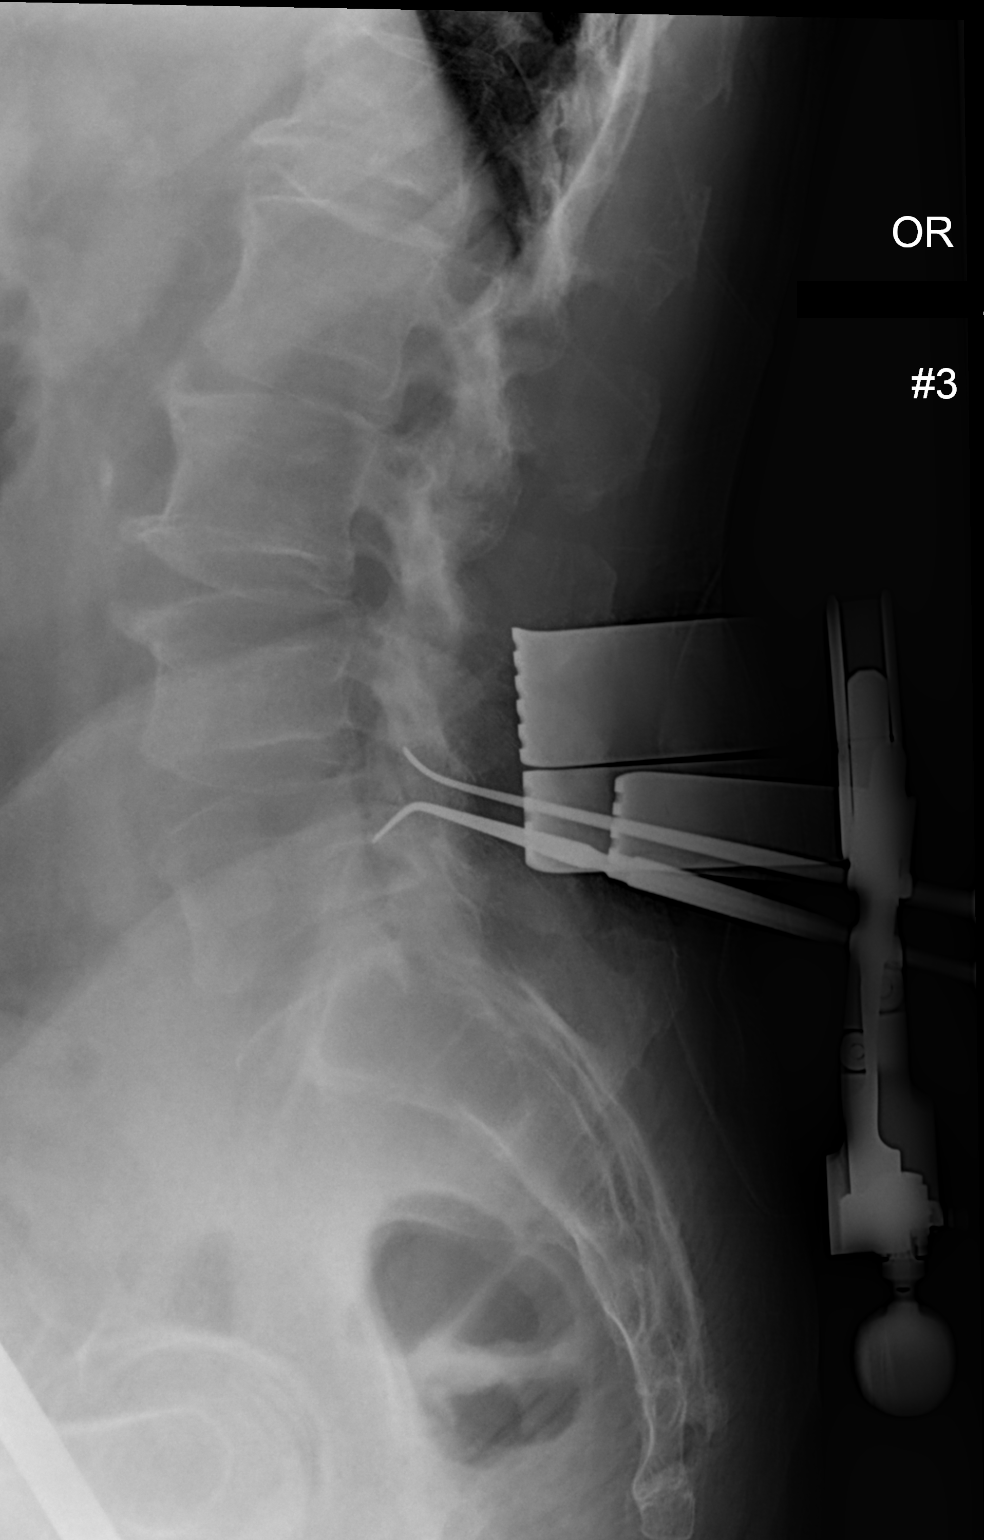

[1 of 1 positions shown; findings below may reference images not displayed]

FINDINGS: Two surgical and instruments have been advanced overlying the spinal
canal at the L4-5 level.
IMPRESSION: L4-5 localized.

## 2015-10-31 DIAGNOSIS — B353 Tinea pedis: Secondary | ICD-10-CM | POA: Diagnosis not present

## 2015-10-31 DIAGNOSIS — N529 Male erectile dysfunction, unspecified: Secondary | ICD-10-CM | POA: Diagnosis not present

## 2015-10-31 DIAGNOSIS — Z7984 Long term (current) use of oral hypoglycemic drugs: Secondary | ICD-10-CM | POA: Diagnosis not present

## 2015-10-31 DIAGNOSIS — I10 Essential (primary) hypertension: Secondary | ICD-10-CM | POA: Diagnosis not present

## 2015-10-31 DIAGNOSIS — E782 Mixed hyperlipidemia: Secondary | ICD-10-CM | POA: Diagnosis not present

## 2015-10-31 DIAGNOSIS — Z86010 Personal history of colonic polyps: Secondary | ICD-10-CM | POA: Diagnosis not present

## 2015-10-31 DIAGNOSIS — E119 Type 2 diabetes mellitus without complications: Secondary | ICD-10-CM | POA: Diagnosis not present

## 2015-11-27 DIAGNOSIS — H25043 Posterior subcapsular polar age-related cataract, bilateral: Secondary | ICD-10-CM | POA: Diagnosis not present

## 2015-11-27 DIAGNOSIS — H5213 Myopia, bilateral: Secondary | ICD-10-CM | POA: Diagnosis not present

## 2015-11-27 DIAGNOSIS — E119 Type 2 diabetes mellitus without complications: Secondary | ICD-10-CM | POA: Diagnosis not present

## 2015-11-27 DIAGNOSIS — H1859 Other hereditary corneal dystrophies: Secondary | ICD-10-CM | POA: Diagnosis not present

## 2016-01-12 DIAGNOSIS — B373 Candidiasis of vulva and vagina: Secondary | ICD-10-CM | POA: Diagnosis not present

## 2016-05-20 DIAGNOSIS — I10 Essential (primary) hypertension: Secondary | ICD-10-CM | POA: Diagnosis not present

## 2016-05-20 DIAGNOSIS — Z86010 Personal history of colonic polyps: Secondary | ICD-10-CM | POA: Diagnosis not present

## 2016-05-20 DIAGNOSIS — Z0000 Encounter for general adult medical examination without abnormal findings: Secondary | ICD-10-CM | POA: Diagnosis not present

## 2016-05-20 DIAGNOSIS — Z23 Encounter for immunization: Secondary | ICD-10-CM | POA: Diagnosis not present

## 2016-05-20 DIAGNOSIS — Z1389 Encounter for screening for other disorder: Secondary | ICD-10-CM | POA: Diagnosis not present

## 2016-05-20 DIAGNOSIS — Z7984 Long term (current) use of oral hypoglycemic drugs: Secondary | ICD-10-CM | POA: Diagnosis not present

## 2016-05-20 DIAGNOSIS — E782 Mixed hyperlipidemia: Secondary | ICD-10-CM | POA: Diagnosis not present

## 2016-05-20 DIAGNOSIS — E119 Type 2 diabetes mellitus without complications: Secondary | ICD-10-CM | POA: Diagnosis not present

## 2016-05-20 DIAGNOSIS — N529 Male erectile dysfunction, unspecified: Secondary | ICD-10-CM | POA: Diagnosis not present

## 2016-05-20 DIAGNOSIS — B353 Tinea pedis: Secondary | ICD-10-CM | POA: Diagnosis not present

## 2016-12-03 DIAGNOSIS — E119 Type 2 diabetes mellitus without complications: Secondary | ICD-10-CM | POA: Diagnosis not present

## 2016-12-03 DIAGNOSIS — H2513 Age-related nuclear cataract, bilateral: Secondary | ICD-10-CM | POA: Diagnosis not present

## 2016-12-03 DIAGNOSIS — H5213 Myopia, bilateral: Secondary | ICD-10-CM | POA: Diagnosis not present

## 2016-12-03 DIAGNOSIS — H1859 Other hereditary corneal dystrophies: Secondary | ICD-10-CM | POA: Diagnosis not present

## 2017-01-06 DIAGNOSIS — N529 Male erectile dysfunction, unspecified: Secondary | ICD-10-CM | POA: Diagnosis not present

## 2017-01-06 DIAGNOSIS — Z86010 Personal history of colonic polyps: Secondary | ICD-10-CM | POA: Diagnosis not present

## 2017-01-06 DIAGNOSIS — I10 Essential (primary) hypertension: Secondary | ICD-10-CM | POA: Diagnosis not present

## 2017-01-06 DIAGNOSIS — E782 Mixed hyperlipidemia: Secondary | ICD-10-CM | POA: Diagnosis not present

## 2017-01-06 DIAGNOSIS — E119 Type 2 diabetes mellitus without complications: Secondary | ICD-10-CM | POA: Diagnosis not present

## 2017-02-12 DIAGNOSIS — E119 Type 2 diabetes mellitus without complications: Secondary | ICD-10-CM | POA: Diagnosis not present

## 2017-02-12 DIAGNOSIS — Z86010 Personal history of colonic polyps: Secondary | ICD-10-CM | POA: Diagnosis not present

## 2017-02-12 DIAGNOSIS — I10 Essential (primary) hypertension: Secondary | ICD-10-CM | POA: Diagnosis not present

## 2017-02-12 DIAGNOSIS — Z7984 Long term (current) use of oral hypoglycemic drugs: Secondary | ICD-10-CM | POA: Diagnosis not present

## 2017-03-19 DIAGNOSIS — K5730 Diverticulosis of large intestine without perforation or abscess without bleeding: Secondary | ICD-10-CM | POA: Diagnosis not present

## 2017-03-19 DIAGNOSIS — Z86010 Personal history of colonic polyps: Secondary | ICD-10-CM | POA: Diagnosis not present

## 2017-03-19 DIAGNOSIS — K640 First degree hemorrhoids: Secondary | ICD-10-CM | POA: Diagnosis not present

## 2017-03-19 DIAGNOSIS — D126 Benign neoplasm of colon, unspecified: Secondary | ICD-10-CM | POA: Diagnosis not present

## 2017-03-24 DIAGNOSIS — D126 Benign neoplasm of colon, unspecified: Secondary | ICD-10-CM | POA: Diagnosis not present

## 2017-04-08 DIAGNOSIS — E119 Type 2 diabetes mellitus without complications: Secondary | ICD-10-CM | POA: Diagnosis not present

## 2017-04-08 DIAGNOSIS — Z7984 Long term (current) use of oral hypoglycemic drugs: Secondary | ICD-10-CM | POA: Diagnosis not present

## 2017-07-10 DIAGNOSIS — N529 Male erectile dysfunction, unspecified: Secondary | ICD-10-CM | POA: Diagnosis not present

## 2017-07-10 DIAGNOSIS — Z86010 Personal history of colonic polyps: Secondary | ICD-10-CM | POA: Diagnosis not present

## 2017-07-10 DIAGNOSIS — Z0000 Encounter for general adult medical examination without abnormal findings: Secondary | ICD-10-CM | POA: Diagnosis not present

## 2017-07-10 DIAGNOSIS — Z1159 Encounter for screening for other viral diseases: Secondary | ICD-10-CM | POA: Diagnosis not present

## 2017-07-10 DIAGNOSIS — E782 Mixed hyperlipidemia: Secondary | ICD-10-CM | POA: Diagnosis not present

## 2017-07-10 DIAGNOSIS — Z23 Encounter for immunization: Secondary | ICD-10-CM | POA: Diagnosis not present

## 2017-07-10 DIAGNOSIS — Z1389 Encounter for screening for other disorder: Secondary | ICD-10-CM | POA: Diagnosis not present

## 2017-07-10 DIAGNOSIS — E119 Type 2 diabetes mellitus without complications: Secondary | ICD-10-CM | POA: Diagnosis not present

## 2017-07-10 DIAGNOSIS — Z125 Encounter for screening for malignant neoplasm of prostate: Secondary | ICD-10-CM | POA: Diagnosis not present

## 2017-07-10 DIAGNOSIS — I10 Essential (primary) hypertension: Secondary | ICD-10-CM | POA: Diagnosis not present

## 2018-01-20 DIAGNOSIS — I10 Essential (primary) hypertension: Secondary | ICD-10-CM | POA: Diagnosis not present

## 2018-01-20 DIAGNOSIS — E782 Mixed hyperlipidemia: Secondary | ICD-10-CM | POA: Diagnosis not present

## 2018-01-20 DIAGNOSIS — E119 Type 2 diabetes mellitus without complications: Secondary | ICD-10-CM | POA: Diagnosis not present

## 2018-01-20 DIAGNOSIS — Z86010 Personal history of colonic polyps: Secondary | ICD-10-CM | POA: Diagnosis not present

## 2018-01-20 DIAGNOSIS — Z23 Encounter for immunization: Secondary | ICD-10-CM | POA: Diagnosis not present

## 2018-03-08 DIAGNOSIS — H1789 Other corneal scars and opacities: Secondary | ICD-10-CM | POA: Diagnosis not present

## 2018-05-03 DIAGNOSIS — H1859 Other hereditary corneal dystrophies: Secondary | ICD-10-CM | POA: Diagnosis not present

## 2018-05-03 DIAGNOSIS — H1789 Other corneal scars and opacities: Secondary | ICD-10-CM | POA: Diagnosis not present

## 2018-07-02 DIAGNOSIS — H2513 Age-related nuclear cataract, bilateral: Secondary | ICD-10-CM | POA: Diagnosis not present

## 2018-07-20 DIAGNOSIS — E119 Type 2 diabetes mellitus without complications: Secondary | ICD-10-CM | POA: Diagnosis not present

## 2018-07-20 DIAGNOSIS — Z1389 Encounter for screening for other disorder: Secondary | ICD-10-CM | POA: Diagnosis not present

## 2018-07-20 DIAGNOSIS — I10 Essential (primary) hypertension: Secondary | ICD-10-CM | POA: Diagnosis not present

## 2018-07-20 DIAGNOSIS — E782 Mixed hyperlipidemia: Secondary | ICD-10-CM | POA: Diagnosis not present

## 2018-07-20 DIAGNOSIS — Z0000 Encounter for general adult medical examination without abnormal findings: Secondary | ICD-10-CM | POA: Diagnosis not present

## 2018-07-20 DIAGNOSIS — Z86010 Personal history of colonic polyps: Secondary | ICD-10-CM | POA: Diagnosis not present

## 2018-08-05 DIAGNOSIS — H25811 Combined forms of age-related cataract, right eye: Secondary | ICD-10-CM | POA: Diagnosis not present

## 2018-08-05 DIAGNOSIS — H2511 Age-related nuclear cataract, right eye: Secondary | ICD-10-CM | POA: Diagnosis not present

## 2018-08-12 DIAGNOSIS — H25812 Combined forms of age-related cataract, left eye: Secondary | ICD-10-CM | POA: Diagnosis not present

## 2018-08-12 DIAGNOSIS — H2512 Age-related nuclear cataract, left eye: Secondary | ICD-10-CM | POA: Diagnosis not present

## 2019-01-21 DIAGNOSIS — Z7984 Long term (current) use of oral hypoglycemic drugs: Secondary | ICD-10-CM | POA: Diagnosis not present

## 2019-01-21 DIAGNOSIS — Z86010 Personal history of colonic polyps: Secondary | ICD-10-CM | POA: Diagnosis not present

## 2019-01-21 DIAGNOSIS — I10 Essential (primary) hypertension: Secondary | ICD-10-CM | POA: Diagnosis not present

## 2019-01-21 DIAGNOSIS — E1169 Type 2 diabetes mellitus with other specified complication: Secondary | ICD-10-CM | POA: Diagnosis not present

## 2019-01-21 DIAGNOSIS — E782 Mixed hyperlipidemia: Secondary | ICD-10-CM | POA: Diagnosis not present

## 2019-03-08 DIAGNOSIS — H26493 Other secondary cataract, bilateral: Secondary | ICD-10-CM | POA: Diagnosis not present

## 2019-07-22 DIAGNOSIS — Z86010 Personal history of colonic polyps: Secondary | ICD-10-CM | POA: Diagnosis not present

## 2019-07-22 DIAGNOSIS — Z1389 Encounter for screening for other disorder: Secondary | ICD-10-CM | POA: Diagnosis not present

## 2019-07-22 DIAGNOSIS — Z0000 Encounter for general adult medical examination without abnormal findings: Secondary | ICD-10-CM | POA: Diagnosis not present

## 2019-07-22 DIAGNOSIS — Z125 Encounter for screening for malignant neoplasm of prostate: Secondary | ICD-10-CM | POA: Diagnosis not present

## 2019-07-22 DIAGNOSIS — E782 Mixed hyperlipidemia: Secondary | ICD-10-CM | POA: Diagnosis not present

## 2019-07-22 DIAGNOSIS — I10 Essential (primary) hypertension: Secondary | ICD-10-CM | POA: Diagnosis not present

## 2019-07-22 DIAGNOSIS — E1169 Type 2 diabetes mellitus with other specified complication: Secondary | ICD-10-CM | POA: Diagnosis not present

## 2019-07-27 DIAGNOSIS — Z125 Encounter for screening for malignant neoplasm of prostate: Secondary | ICD-10-CM | POA: Diagnosis not present

## 2019-07-27 DIAGNOSIS — I10 Essential (primary) hypertension: Secondary | ICD-10-CM | POA: Diagnosis not present

## 2019-07-27 DIAGNOSIS — E1169 Type 2 diabetes mellitus with other specified complication: Secondary | ICD-10-CM | POA: Diagnosis not present

## 2019-07-27 DIAGNOSIS — E782 Mixed hyperlipidemia: Secondary | ICD-10-CM | POA: Diagnosis not present

## 2019-08-26 DIAGNOSIS — R972 Elevated prostate specific antigen [PSA]: Secondary | ICD-10-CM | POA: Diagnosis not present

## 2020-01-20 DIAGNOSIS — Z86010 Personal history of colonic polyps: Secondary | ICD-10-CM | POA: Diagnosis not present

## 2020-01-20 DIAGNOSIS — I10 Essential (primary) hypertension: Secondary | ICD-10-CM | POA: Diagnosis not present

## 2020-01-20 DIAGNOSIS — E782 Mixed hyperlipidemia: Secondary | ICD-10-CM | POA: Diagnosis not present

## 2020-01-20 DIAGNOSIS — E1169 Type 2 diabetes mellitus with other specified complication: Secondary | ICD-10-CM | POA: Diagnosis not present

## 2020-03-13 DIAGNOSIS — H26493 Other secondary cataract, bilateral: Secondary | ICD-10-CM | POA: Diagnosis not present

## 2020-03-13 DIAGNOSIS — Z961 Presence of intraocular lens: Secondary | ICD-10-CM | POA: Diagnosis not present

## 2020-03-13 DIAGNOSIS — H52203 Unspecified astigmatism, bilateral: Secondary | ICD-10-CM | POA: Diagnosis not present

## 2020-03-13 DIAGNOSIS — E119 Type 2 diabetes mellitus without complications: Secondary | ICD-10-CM | POA: Diagnosis not present

## 2020-07-24 DIAGNOSIS — Z0000 Encounter for general adult medical examination without abnormal findings: Secondary | ICD-10-CM | POA: Diagnosis not present

## 2020-07-24 DIAGNOSIS — I10 Essential (primary) hypertension: Secondary | ICD-10-CM | POA: Diagnosis not present

## 2020-07-24 DIAGNOSIS — Z125 Encounter for screening for malignant neoplasm of prostate: Secondary | ICD-10-CM | POA: Diagnosis not present

## 2020-07-24 DIAGNOSIS — Z86010 Personal history of colonic polyps: Secondary | ICD-10-CM | POA: Diagnosis not present

## 2020-07-24 DIAGNOSIS — Z1389 Encounter for screening for other disorder: Secondary | ICD-10-CM | POA: Diagnosis not present

## 2020-07-24 DIAGNOSIS — E1169 Type 2 diabetes mellitus with other specified complication: Secondary | ICD-10-CM | POA: Diagnosis not present

## 2020-07-24 DIAGNOSIS — E782 Mixed hyperlipidemia: Secondary | ICD-10-CM | POA: Diagnosis not present

## 2020-08-03 DIAGNOSIS — M545 Low back pain, unspecified: Secondary | ICD-10-CM | POA: Diagnosis not present

## 2020-08-08 DIAGNOSIS — M47896 Other spondylosis, lumbar region: Secondary | ICD-10-CM | POA: Diagnosis not present

## 2020-08-27 DIAGNOSIS — M47896 Other spondylosis, lumbar region: Secondary | ICD-10-CM | POA: Diagnosis not present

## 2020-09-01 DIAGNOSIS — M545 Low back pain, unspecified: Secondary | ICD-10-CM | POA: Diagnosis not present

## 2020-09-03 DIAGNOSIS — M47896 Other spondylosis, lumbar region: Secondary | ICD-10-CM | POA: Diagnosis not present

## 2020-09-05 DIAGNOSIS — M5416 Radiculopathy, lumbar region: Secondary | ICD-10-CM | POA: Diagnosis not present

## 2020-09-27 DIAGNOSIS — M5416 Radiculopathy, lumbar region: Secondary | ICD-10-CM | POA: Diagnosis not present

## 2020-10-02 DIAGNOSIS — M5416 Radiculopathy, lumbar region: Secondary | ICD-10-CM | POA: Diagnosis not present

## 2020-10-18 DIAGNOSIS — M5416 Radiculopathy, lumbar region: Secondary | ICD-10-CM | POA: Diagnosis not present

## 2020-10-31 DIAGNOSIS — Z6829 Body mass index (BMI) 29.0-29.9, adult: Secondary | ICD-10-CM | POA: Diagnosis not present

## 2020-10-31 DIAGNOSIS — I10 Essential (primary) hypertension: Secondary | ICD-10-CM | POA: Diagnosis not present

## 2020-10-31 DIAGNOSIS — M48062 Spinal stenosis, lumbar region with neurogenic claudication: Secondary | ICD-10-CM | POA: Diagnosis not present

## 2020-11-13 DIAGNOSIS — M48062 Spinal stenosis, lumbar region with neurogenic claudication: Secondary | ICD-10-CM | POA: Diagnosis not present

## 2021-01-25 DIAGNOSIS — E1169 Type 2 diabetes mellitus with other specified complication: Secondary | ICD-10-CM | POA: Diagnosis not present

## 2021-01-25 DIAGNOSIS — Z23 Encounter for immunization: Secondary | ICD-10-CM | POA: Diagnosis not present

## 2021-01-25 DIAGNOSIS — E782 Mixed hyperlipidemia: Secondary | ICD-10-CM | POA: Diagnosis not present

## 2021-01-25 DIAGNOSIS — I10 Essential (primary) hypertension: Secondary | ICD-10-CM | POA: Diagnosis not present

## 2021-01-25 DIAGNOSIS — Z86010 Personal history of colonic polyps: Secondary | ICD-10-CM | POA: Diagnosis not present

## 2021-03-19 DIAGNOSIS — E119 Type 2 diabetes mellitus without complications: Secondary | ICD-10-CM | POA: Diagnosis not present

## 2021-03-19 DIAGNOSIS — H47021 Hemorrhage in optic nerve sheath, right eye: Secondary | ICD-10-CM | POA: Diagnosis not present

## 2021-03-19 DIAGNOSIS — H26491 Other secondary cataract, right eye: Secondary | ICD-10-CM | POA: Diagnosis not present

## 2021-03-19 DIAGNOSIS — H524 Presbyopia: Secondary | ICD-10-CM | POA: Diagnosis not present

## 2021-07-26 DIAGNOSIS — E1169 Type 2 diabetes mellitus with other specified complication: Secondary | ICD-10-CM | POA: Diagnosis not present

## 2021-07-26 DIAGNOSIS — Z86010 Personal history of colonic polyps: Secondary | ICD-10-CM | POA: Diagnosis not present

## 2021-07-26 DIAGNOSIS — Z0000 Encounter for general adult medical examination without abnormal findings: Secondary | ICD-10-CM | POA: Diagnosis not present

## 2021-07-26 DIAGNOSIS — Z23 Encounter for immunization: Secondary | ICD-10-CM | POA: Diagnosis not present

## 2021-07-26 DIAGNOSIS — E782 Mixed hyperlipidemia: Secondary | ICD-10-CM | POA: Diagnosis not present

## 2021-07-26 DIAGNOSIS — Z1389 Encounter for screening for other disorder: Secondary | ICD-10-CM | POA: Diagnosis not present

## 2021-07-26 DIAGNOSIS — I10 Essential (primary) hypertension: Secondary | ICD-10-CM | POA: Diagnosis not present

## 2021-09-24 DIAGNOSIS — H26493 Other secondary cataract, bilateral: Secondary | ICD-10-CM | POA: Diagnosis not present

## 2021-09-24 DIAGNOSIS — H47021 Hemorrhage in optic nerve sheath, right eye: Secondary | ICD-10-CM | POA: Diagnosis not present

## 2022-01-23 DIAGNOSIS — Z86010 Personal history of colonic polyps: Secondary | ICD-10-CM | POA: Diagnosis not present

## 2022-01-23 DIAGNOSIS — E1169 Type 2 diabetes mellitus with other specified complication: Secondary | ICD-10-CM | POA: Diagnosis not present

## 2022-01-23 DIAGNOSIS — I10 Essential (primary) hypertension: Secondary | ICD-10-CM | POA: Diagnosis not present

## 2022-01-23 DIAGNOSIS — E782 Mixed hyperlipidemia: Secondary | ICD-10-CM | POA: Diagnosis not present

## 2022-07-28 DIAGNOSIS — Z0000 Encounter for general adult medical examination without abnormal findings: Secondary | ICD-10-CM | POA: Diagnosis not present

## 2022-07-28 DIAGNOSIS — E782 Mixed hyperlipidemia: Secondary | ICD-10-CM | POA: Diagnosis not present

## 2022-07-28 DIAGNOSIS — E1169 Type 2 diabetes mellitus with other specified complication: Secondary | ICD-10-CM | POA: Diagnosis not present

## 2022-07-28 DIAGNOSIS — Z125 Encounter for screening for malignant neoplasm of prostate: Secondary | ICD-10-CM | POA: Diagnosis not present

## 2022-07-28 DIAGNOSIS — Z1331 Encounter for screening for depression: Secondary | ICD-10-CM | POA: Diagnosis not present

## 2022-07-28 DIAGNOSIS — Z86010 Personal history of colonic polyps: Secondary | ICD-10-CM | POA: Diagnosis not present

## 2022-07-28 DIAGNOSIS — I10 Essential (primary) hypertension: Secondary | ICD-10-CM | POA: Diagnosis not present

## 2022-10-08 DIAGNOSIS — H52203 Unspecified astigmatism, bilateral: Secondary | ICD-10-CM | POA: Diagnosis not present

## 2022-10-08 DIAGNOSIS — E119 Type 2 diabetes mellitus without complications: Secondary | ICD-10-CM | POA: Diagnosis not present

## 2022-10-08 DIAGNOSIS — H26491 Other secondary cataract, right eye: Secondary | ICD-10-CM | POA: Diagnosis not present

## 2022-11-05 DIAGNOSIS — H26491 Other secondary cataract, right eye: Secondary | ICD-10-CM | POA: Diagnosis not present

## 2022-11-19 DIAGNOSIS — H26492 Other secondary cataract, left eye: Secondary | ICD-10-CM | POA: Diagnosis not present

## 2023-01-14 DIAGNOSIS — M48062 Spinal stenosis, lumbar region with neurogenic claudication: Secondary | ICD-10-CM | POA: Diagnosis not present

## 2023-01-14 DIAGNOSIS — Z6829 Body mass index (BMI) 29.0-29.9, adult: Secondary | ICD-10-CM | POA: Diagnosis not present

## 2023-01-26 DIAGNOSIS — E1169 Type 2 diabetes mellitus with other specified complication: Secondary | ICD-10-CM | POA: Diagnosis not present

## 2023-01-26 DIAGNOSIS — E119 Type 2 diabetes mellitus without complications: Secondary | ICD-10-CM | POA: Diagnosis not present

## 2023-01-26 DIAGNOSIS — Z86010 Personal history of colonic polyps: Secondary | ICD-10-CM | POA: Diagnosis not present

## 2023-01-26 DIAGNOSIS — E782 Mixed hyperlipidemia: Secondary | ICD-10-CM | POA: Diagnosis not present

## 2023-01-26 DIAGNOSIS — I10 Essential (primary) hypertension: Secondary | ICD-10-CM | POA: Diagnosis not present

## 2023-01-27 DIAGNOSIS — M5126 Other intervertebral disc displacement, lumbar region: Secondary | ICD-10-CM | POA: Diagnosis not present

## 2023-01-27 DIAGNOSIS — M48062 Spinal stenosis, lumbar region with neurogenic claudication: Secondary | ICD-10-CM | POA: Diagnosis not present

## 2023-02-04 DIAGNOSIS — M48062 Spinal stenosis, lumbar region with neurogenic claudication: Secondary | ICD-10-CM | POA: Diagnosis not present

## 2023-02-04 DIAGNOSIS — Z6829 Body mass index (BMI) 29.0-29.9, adult: Secondary | ICD-10-CM | POA: Diagnosis not present

## 2023-08-04 DIAGNOSIS — Z125 Encounter for screening for malignant neoplasm of prostate: Secondary | ICD-10-CM | POA: Diagnosis not present

## 2023-08-04 DIAGNOSIS — I10 Essential (primary) hypertension: Secondary | ICD-10-CM | POA: Diagnosis not present

## 2023-08-04 DIAGNOSIS — E782 Mixed hyperlipidemia: Secondary | ICD-10-CM | POA: Diagnosis not present

## 2023-08-04 DIAGNOSIS — E1169 Type 2 diabetes mellitus with other specified complication: Secondary | ICD-10-CM | POA: Diagnosis not present

## 2023-08-05 DIAGNOSIS — Z0000 Encounter for general adult medical examination without abnormal findings: Secondary | ICD-10-CM | POA: Diagnosis not present

## 2023-08-05 DIAGNOSIS — I10 Essential (primary) hypertension: Secondary | ICD-10-CM | POA: Diagnosis not present

## 2023-08-05 DIAGNOSIS — E119 Type 2 diabetes mellitus without complications: Secondary | ICD-10-CM | POA: Diagnosis not present

## 2023-08-05 DIAGNOSIS — E782 Mixed hyperlipidemia: Secondary | ICD-10-CM | POA: Diagnosis not present

## 2023-08-05 DIAGNOSIS — Z1211 Encounter for screening for malignant neoplasm of colon: Secondary | ICD-10-CM | POA: Diagnosis not present

## 2023-08-05 DIAGNOSIS — Z125 Encounter for screening for malignant neoplasm of prostate: Secondary | ICD-10-CM | POA: Diagnosis not present

## 2023-08-14 DIAGNOSIS — Z1211 Encounter for screening for malignant neoplasm of colon: Secondary | ICD-10-CM | POA: Diagnosis not present

## 2024-01-29 DIAGNOSIS — E1169 Type 2 diabetes mellitus with other specified complication: Secondary | ICD-10-CM | POA: Diagnosis not present

## 2024-01-29 DIAGNOSIS — I10 Essential (primary) hypertension: Secondary | ICD-10-CM | POA: Diagnosis not present

## 2024-01-29 DIAGNOSIS — E782 Mixed hyperlipidemia: Secondary | ICD-10-CM | POA: Diagnosis not present

## 2024-02-01 DIAGNOSIS — E1169 Type 2 diabetes mellitus with other specified complication: Secondary | ICD-10-CM | POA: Diagnosis not present

## 2024-02-01 DIAGNOSIS — E782 Mixed hyperlipidemia: Secondary | ICD-10-CM | POA: Diagnosis not present

## 2024-02-01 DIAGNOSIS — E538 Deficiency of other specified B group vitamins: Secondary | ICD-10-CM | POA: Diagnosis not present

## 2024-02-01 DIAGNOSIS — I10 Essential (primary) hypertension: Secondary | ICD-10-CM | POA: Diagnosis not present

## 2024-02-03 DIAGNOSIS — E538 Deficiency of other specified B group vitamins: Secondary | ICD-10-CM | POA: Diagnosis not present

## 2024-02-05 DIAGNOSIS — E538 Deficiency of other specified B group vitamins: Secondary | ICD-10-CM | POA: Diagnosis not present

## 2024-02-12 DIAGNOSIS — E538 Deficiency of other specified B group vitamins: Secondary | ICD-10-CM | POA: Diagnosis not present

## 2024-02-19 DIAGNOSIS — E538 Deficiency of other specified B group vitamins: Secondary | ICD-10-CM | POA: Diagnosis not present

## 2024-02-26 DIAGNOSIS — E538 Deficiency of other specified B group vitamins: Secondary | ICD-10-CM | POA: Diagnosis not present

## 2024-03-04 DIAGNOSIS — E538 Deficiency of other specified B group vitamins: Secondary | ICD-10-CM | POA: Diagnosis not present

## 2024-03-11 DIAGNOSIS — E538 Deficiency of other specified B group vitamins: Secondary | ICD-10-CM | POA: Diagnosis not present

## 2024-03-18 DIAGNOSIS — E538 Deficiency of other specified B group vitamins: Secondary | ICD-10-CM | POA: Diagnosis not present

## 2024-03-24 DIAGNOSIS — E1169 Type 2 diabetes mellitus with other specified complication: Secondary | ICD-10-CM | POA: Diagnosis not present

## 2024-04-18 DIAGNOSIS — R0609 Other forms of dyspnea: Secondary | ICD-10-CM | POA: Diagnosis not present

## 2024-04-18 DIAGNOSIS — R001 Bradycardia, unspecified: Secondary | ICD-10-CM | POA: Diagnosis not present

## 2024-04-18 DIAGNOSIS — I951 Orthostatic hypotension: Secondary | ICD-10-CM | POA: Diagnosis not present

## 2024-04-18 DIAGNOSIS — R5383 Other fatigue: Secondary | ICD-10-CM | POA: Diagnosis not present

## 2024-04-18 DIAGNOSIS — R42 Dizziness and giddiness: Secondary | ICD-10-CM | POA: Diagnosis not present

## 2024-07-27 DIAGNOSIS — E782 Mixed hyperlipidemia: Secondary | ICD-10-CM | POA: Diagnosis not present

## 2024-07-27 DIAGNOSIS — I10 Essential (primary) hypertension: Secondary | ICD-10-CM | POA: Diagnosis not present

## 2024-07-27 DIAGNOSIS — Z125 Encounter for screening for malignant neoplasm of prostate: Secondary | ICD-10-CM | POA: Diagnosis not present

## 2024-07-27 DIAGNOSIS — E1169 Type 2 diabetes mellitus with other specified complication: Secondary | ICD-10-CM | POA: Diagnosis not present

## 2024-07-28 DIAGNOSIS — Z125 Encounter for screening for malignant neoplasm of prostate: Secondary | ICD-10-CM | POA: Diagnosis not present

## 2024-07-28 DIAGNOSIS — Z1211 Encounter for screening for malignant neoplasm of colon: Secondary | ICD-10-CM | POA: Diagnosis not present

## 2024-07-28 DIAGNOSIS — I10 Essential (primary) hypertension: Secondary | ICD-10-CM | POA: Diagnosis not present

## 2024-07-28 DIAGNOSIS — E1169 Type 2 diabetes mellitus with other specified complication: Secondary | ICD-10-CM | POA: Diagnosis not present

## 2024-07-28 DIAGNOSIS — I209 Angina pectoris, unspecified: Secondary | ICD-10-CM | POA: Diagnosis not present

## 2024-07-28 DIAGNOSIS — Z0000 Encounter for general adult medical examination without abnormal findings: Secondary | ICD-10-CM | POA: Diagnosis not present

## 2024-07-28 DIAGNOSIS — E782 Mixed hyperlipidemia: Secondary | ICD-10-CM | POA: Diagnosis not present

## 2024-07-28 DIAGNOSIS — Z0189 Encounter for other specified special examinations: Secondary | ICD-10-CM | POA: Diagnosis not present

## 2024-07-28 LAB — LAB REPORT - SCANNED
A1c: 7
Creatinine, POC: 253 mg/dL
EGFR: 79
Microalb Creat Ratio: 12.7
Microalbumin, Urine: 3.2

## 2024-07-29 DIAGNOSIS — Z1211 Encounter for screening for malignant neoplasm of colon: Secondary | ICD-10-CM | POA: Diagnosis not present

## 2024-08-01 ENCOUNTER — Ambulatory Visit: Attending: Cardiology | Admitting: Cardiology

## 2024-08-01 ENCOUNTER — Encounter: Payer: Self-pay | Admitting: Cardiology

## 2024-08-01 VITALS — BP 144/92 | HR 64 | Ht 68.0 in | Wt 192.6 lb

## 2024-08-01 DIAGNOSIS — R001 Bradycardia, unspecified: Secondary | ICD-10-CM

## 2024-08-01 DIAGNOSIS — R0789 Other chest pain: Secondary | ICD-10-CM | POA: Diagnosis not present

## 2024-08-01 DIAGNOSIS — I10 Essential (primary) hypertension: Secondary | ICD-10-CM | POA: Diagnosis not present

## 2024-08-01 DIAGNOSIS — E782 Mixed hyperlipidemia: Secondary | ICD-10-CM | POA: Diagnosis not present

## 2024-08-01 DIAGNOSIS — I200 Unstable angina: Secondary | ICD-10-CM

## 2024-08-01 DIAGNOSIS — R009 Unspecified abnormalities of heart beat: Secondary | ICD-10-CM | POA: Diagnosis not present

## 2024-08-01 MED ORDER — NITROGLYCERIN 0.4 MG SL SUBL: 0.4000 mg | SUBLINGUAL_TABLET | SUBLINGUAL | 3 refills | Status: AC | PRN

## 2024-08-01 MED ORDER — ATENOLOL 25 MG PO TABS: 25.0000 mg | ORAL_TABLET | Freq: Two times a day (BID) | ORAL | 3 refills | Status: DC

## 2024-08-01 MED ORDER — AMLODIPINE BESYLATE 5 MG PO TABS: 5.0000 mg | ORAL_TABLET | Freq: Every day | ORAL | 3 refills | Status: DC

## 2024-08-01 MED ORDER — LOSARTAN POTASSIUM 100 MG PO TABS: 100.0000 mg | ORAL_TABLET | Freq: Every day | ORAL | 3 refills | Status: AC

## 2024-08-01 NOTE — Patient Instructions (Signed)
 Medication Instructions:  Your physician has recommended you make the following change in your medication:   DECREASE Atenolol  to 25 mg taking 1 twice a day  INCREASE Losartan to 100 mg taking 1 daily  START Amlodipine 5 mg taking 1 daily  START Nitroglycerin 0.4 s/l tablets.... The proper use and anticipated side effects of nitroglycerine has been carefully explained.  If a single episode of chest pain is not relieved by one tablet, the patient will try another within 5 minutes; and if this doesn't relieve the pain, the patient is instructed to call 911 for transportation to an emergency department.   *If you need a refill on your cardiac medications before your next appointment, please call your pharmacy*  Lab Work: TODAY:  BMET, MAG, & CBC  If you have labs (blood work) drawn today and your tests are completely normal, you will receive your results only by: MyChart Message (if you have MyChart) OR A paper copy in the mail If you have any lab test that is abnormal or we need to change your treatment, we will call you to review the results.  Testing/Procedures: Your physician has requested that you have an echocardiogram. Echocardiography is a painless test that uses sound waves to create images of your heart. It provides your doctor with information about the size and shape of your heart and how well your heart's chambers and valves are working. This procedure takes approximately one hour. There are no restrictions for this procedure. Please do NOT wear cologne, perfume, aftershave, or lotions (deodorant is allowed). Please arrive 15 minutes prior to your appointment time.  Please note: We ask at that you not bring children with you during ultrasound (echo/ vascular) testing. Due to room size and safety concerns, children are not allowed in the ultrasound rooms during exams. Our front office staff cannot provide observation of children in our lobby area while testing is being conducted. An  adult accompanying a patient to their appointment will only be allowed in the ultrasound room at the discretion of the ultrasound technician under special circumstances. We apologize for any inconvenience.   Your physician has requested that you have a cardiac catheterization. Cardiac catheterization is used to diagnose and/or treat various heart conditions. Doctors may recommend this procedure for a number of different reasons. The most common reason is to evaluate chest pain. Chest pain can be a symptom of coronary artery disease (CAD), and cardiac catheterization can show whether plaque is narrowing or blocking your heart's arteries. This procedure is also used to evaluate the valves, as well as measure the blood flow and oxygen levels in different parts of your heart. For further information please visit [email or web address] Please follow instruction sheet, BELOW:         Cardiac/Peripheral Catheterization   You are scheduled for a Cardiac Catheterization on Thursday, November 13 with Dr. Newman Lawrence.  1. Please arrive at the [facility] (Main Entrance A) at [facility]: [address] [place], [place] [address] at 10:00 AM (This time is 2 hour(s) before your procedure to ensure your preparation).   Free valet parking service is available. You will check in at ADMITTING. The support person will be asked to wait in the waiting room.  It is OK to have someone drop you off and come back when you are ready to be discharged.        Special note: Every effort is made to have your procedure done on time. Please understand that emergencies sometimes  delay scheduled procedures.  2. Diet: Nothing to eat after midnight.  3. Hydration:You need to be well hydrated before your procedure. On THURSDAY 13, you may drink approved liquids (see below) until 2 hours before the procedure, with 16 oz of water as your last intake.   List of approved liquids water, clear juice, clear tea, black  coffee, fruit juices, non-citric and without pulp, carbonated beverages, Gatorade, Kool -Aid, plain Jello-O and plain ice popsicles.  4. Labs: You will need to have blood drawn on  TODAY ON THE 1ST FLOOR  5. Medication instructions in preparation for your procedure:   Contrast Allergy: No   Stop  Hydrochlorothiazide   and Losartan 24 hours before the cath, do not take after 08/03/24 11:00 a.m.    Do not take Diabetes Med Glucophage  (Metformin ) on the day of the procedure and HOLD 48 HOURS AFTER THE PROCEDURE.  On the morning of your procedure, take Aspirin 81 mg and any morning medicines NOT listed above.  You may use sips of water.  6. Plan to go home the same day, you will only stay overnight if medically necessary. 7. You MUST have a responsible adult to drive you home. 8. An adult MUST be with you the first 24 hours after you arrive home. 9. Bring a current list of your medications, and the last time and date medication taken. 10. Bring ID and current insurance cards. 11.Please wear clothes that are easy to get on and off and wear slip-on shoes.  Thank you for allowing us  to care for you!   --  Invasive Cardiovascular services   Follow-Up: At [facility], you and your health needs are our priority.  As part of our continuing mission to provide you with exceptional heart care, our providers are all part of one team.  This team includes your primary Cardiologist (physician) and Advanced Practice Providers or APPs (Physician Assistants and Nurse Practitioners) who all work together to provide you with the care you need, when you need it.  Your next appointment:   2 week(s)  Provider:   Rollo Louder, PA-C          We recommend signing up for the patient portal called MyChart.  Sign up information is provided on this After Visit Summary.  MyChart is used to connect with patients for Virtual Visits (Telemedicine).  Patients are able to view lab/test  results, encounter notes, upcoming appointments, etc.  Non-urgent messages can be sent to your provider as well.   To learn more about what you can do with MyChart, go to [email or web address].   Other Instructions

## 2024-08-01 NOTE — Progress Notes (Signed)
 Cardiology Office Note   Date:  08/01/2024  ID:  DIAGO HAIK, DOB 06/07/1951, MRN [id number] PCP: Leonel Cole, MD  [facility] Providers Cardiologist:  None     History of Present Illness AMADI FRADY is a 73 y.o. male with a past history of hypertension, type 2 diabetes, hyperlipidemia.  Presents today for evaluation of chest pain.  PCP concern for stable angina.  Today, patient presents for evaluation of chest pain, shortness of breath.  He has also been noticing that his pulse has been low and his BP has been running high.  Patient tells me that in July, he was outside mowing his yard on a hot day.  He developed chest pressure and shortness of breath.  He had to go inside to rest and his symptoms resolved.  Since then, he has had several similar episodes.  He notes that these episodes always occur when he is exerting himself and are relieved by rest.  At first he had symptoms when mowing the lawn.  Then, he started to have episodes when helping his wife vacuum.  More recently, he has been having episodes of chest pressure and shortness of breath when just walking outside to get his mail.  He has also noticed that his blood pressure has been running high at home.  BP often in the 140s-150s systolic.  He also has been having a low pulse rate, heart rate at times in the 40s but predominantly in the 50s.  No syncope or near syncope.  Patient has history of hypertension, hyperlipidemia, type 2 diabetes.  He denies tobacco use.  Denies family history of cardiac problems.  Studies Reviewed EKG Interpretation Date/Time:  Monday August 01 2024 14:50:50 EST Ventricular Rate:  53 PR Interval:  128 QRS Duration:  90 QT Interval:  402 QTC Calculation: 377 R Axis:   23  Text Interpretation: Sinus bradycardia with Premature atrial complexes When compared with ECG of 27-Jul-2013 10:13, Premature atrial complexes are now Present Confirmed by Vicci Sauer [phone]) on 08/01/2024 3:53:37  PM     Risk Assessment/Calculations   HYPERTENSION CONTROL Vitals:   08/01/24 1440 08/01/24 1554  BP: (!) 160/80 (!) 144/92    The patient's blood pressure is elevated above target today.  In order to address the patient's elevated BP: A current anti-hypertensive medication was adjusted today.; A new medication was prescribed today.          Physical Exam VS:  BP (!) 144/92   Pulse 64   Ht 5' 8 (1.727 m)   Wt 192 lb 9.6 oz (87.4 kg)   SpO2 98%   BMI 29.28 kg/m        Wt Readings from Last 3 Encounters:  08/01/24 192 lb 9.6 oz (87.4 kg)  11/02/13 211 lb (95.7 kg)  09/08/13 203 lb (92.1 kg)    GEN: Well nourished, well developed in no acute distress. Sitting comfortably on the exam table  NECK: No JVD CARDIAC: RRR, no murmurs, rubs, gallops. Radial pulses 2+ bilaterally  RESPIRATORY:  Clear to auscultation without rales, wheezing or rhonchi. Normal WOB on room air   ABDOMEN: Soft, non-tender, non-distended EXTREMITIES:  No edema in BLE; No deformity   ASSESSMENT AND PLAN  Unstable Angina  - Patient has been having episodes of chest pressure and dyspnea on exertion since 03/2024.  Brought on by exertion and relieved by rest.  Initially, chest pressure and shortness of breath occurred while mowing his lawn.  Then, he had episodes  while doing housework.  Has progressed to a point where he has chest pressure and shortness of breath when walking to his mailbox - Patient has multiple risk factors for CAD including hypertension, hyperlipidemia, type 2 diabetes - EKG today without ischemic changes - Presentation concerning for unstable angina.  Recommended cardiac catheterization.  Discussed with the who is in agreement.  Patient agrees to proceed -Ordered echocardiogram - Ordered BMP, CBC  - Continue aspirin 81 mg daily - Continue pravastatin 20 mg daily-plan to transition to high intensity statin pending cath results - Add amlodipine 5 mg daily for antianginal benefits -  Sent in prescription for sublingual nitroglycerin.  Educated patient on proper use of this. Discussed ED precautions   HTN  Bradycardia  - Patient has had elevated BP at home.  Often in the 140s-160s systolic.  Heart rate has been low in the 50s, at times in the 40s - Today, BP 140/92.  Heart rate 64 bpm on exam - Patient has been on the atenolol  50 mg twice daily.  Decrease this to 25 mg twice daily - Increase losartan to 100 mg daily - Add amlodipine 5 mg daily - Ordered BMP - Instructed patient to keep BP/heart rate log at home  Type 2 DM  -A1c around 7.0% - Followed by PCP.  On metformin   Hyperlipidemia - Lipid panel from 525 showed LDL not 1 - Continue pravastatin 20 mg daily for now.  Can consider transition to high intensity statin pending cath results.  Did not adjust statin therapy today as we had adjusted multiple blood pressure medications as above  Informed Consent   Shared Decision Making/Informed Consent{  The risks [stroke (1 in 1000), death (1 in 1000), kidney failure [usually temporary] (1 in 500), bleeding (1 in 200), allergic reaction [possibly serious] (1 in 200)], benefits (diagnostic support and management of coronary artery disease) and alternatives of a cardiac catheterization were discussed in detail with Mr. Illes and he is willing to proceed.     Dispo: Follow up in 3-4 weeks with APP. Patient requests to see Dr. Ladona long term   Signed, Rollo FABIENE Louder, PA-C

## 2024-08-02 ENCOUNTER — Telehealth: Payer: Self-pay | Admitting: *Deleted

## 2024-08-02 ENCOUNTER — Ambulatory Visit: Payer: Self-pay | Admitting: Cardiology

## 2024-08-02 LAB — CBC
Hematocrit: 47.2 % (ref 37.5–51.0)
Hemoglobin: 16.1 g/dL (ref 13.0–17.7)
MCH: 31.2 pg (ref 26.6–33.0)
MCHC: 34.1 g/dL (ref 31.5–35.7)
MCV: 92 fL (ref 79–97)
Platelets: 209 x10E3/uL (ref 150–450)
RBC: 5.16 x10E6/uL (ref 4.14–5.80)
RDW: 12.9 % (ref 11.6–15.4)
WBC: 7.6 x10E3/uL (ref 3.4–10.8)

## 2024-08-02 LAB — BASIC METABOLIC PANEL WITH GFR
BUN/Creatinine Ratio: 12 (ref 10–24)
BUN: 13 mg/dL (ref 8–27)
CO2: 24 mmol/L (ref 20–29)
Calcium: 9.9 mg/dL (ref 8.6–10.2)
Chloride: 103 mmol/L (ref 96–106)
Creatinine, Ser: 1.09 mg/dL (ref 0.76–1.27)
Glucose: 109 mg/dL — AB (ref 70–99)
Potassium: 4.6 mmol/L (ref 3.5–5.2)
Sodium: 141 mmol/L (ref 134–144)
eGFR: 71 mL/min/1.73 (ref >59)

## 2024-08-02 LAB — MAGNESIUM: Magnesium: 2 mg/dL (ref 1.6–2.3)

## 2024-08-02 NOTE — Telephone Encounter (Signed)
 Cardiac Catheterization scheduled at [facility] for: Thursday August 04, 2024 12 Noon Arrival time [facility] Main Entrance A at:  10 AM  Diet: -May have light meal until 6 AM. (6 hours before procedure time) Approved light meal consists of plain toast, fruit, light soups, crackers.  Hydration: -May drink clear liquids until 2 hours before the procedure.  Approved liquids: Water, clear tea, black coffee, fruit juices-non-citric and without pulp,Gatorade, plain Jello/popsicles.   -Please drink 16 oz of water 2 hours before procedure.  Medication instructions: -Hold:   Metformin -day of procedure and 48 hours after  -Other usual morning medications can be taken including aspirin 81 mg.  Plan to go home the same day, you will only stay overnight if medically necessary.  You must have responsible adult to drive you home.  Someone must be with you the first 24 hours after you arrive home.  Reviewed procedure instructions with patient.

## 2024-08-04 ENCOUNTER — Other Ambulatory Visit: Payer: Self-pay

## 2024-08-04 ENCOUNTER — Ambulatory Visit (HOSPITAL_COMMUNITY)
Admission: RE | Admit: 2024-08-04 | Discharge: 2024-08-04 | Disposition: A | Discharge status: Home / Self Care | Attending: Cardiology | Admitting: Cardiology

## 2024-08-04 ENCOUNTER — Ambulatory Visit (HOSPITAL_COMMUNITY): Admission: RE | Disposition: A | Payer: Self-pay | Attending: Cardiology

## 2024-08-04 DIAGNOSIS — Z79899 Other long term (current) drug therapy: Secondary | ICD-10-CM | POA: Diagnosis not present

## 2024-08-04 DIAGNOSIS — I25110 Atherosclerotic heart disease of native coronary artery with unstable angina pectoris: Secondary | ICD-10-CM | POA: Diagnosis not present

## 2024-08-04 DIAGNOSIS — I10 Essential (primary) hypertension: Secondary | ICD-10-CM | POA: Diagnosis not present

## 2024-08-04 DIAGNOSIS — Z7984 Long term (current) use of oral hypoglycemic drugs: Secondary | ICD-10-CM | POA: Insufficient documentation

## 2024-08-04 DIAGNOSIS — E119 Type 2 diabetes mellitus without complications: Secondary | ICD-10-CM | POA: Insufficient documentation

## 2024-08-04 DIAGNOSIS — I25119 Atherosclerotic heart disease of native coronary artery with unspecified angina pectoris: Secondary | ICD-10-CM

## 2024-08-04 DIAGNOSIS — R001 Bradycardia, unspecified: Secondary | ICD-10-CM | POA: Diagnosis not present

## 2024-08-04 DIAGNOSIS — Z7982 Long term (current) use of aspirin: Secondary | ICD-10-CM | POA: Diagnosis not present

## 2024-08-04 DIAGNOSIS — E785 Hyperlipidemia, unspecified: Secondary | ICD-10-CM | POA: Insufficient documentation

## 2024-08-04 HISTORY — PX: LEFT HEART CATH AND CORONARY ANGIOGRAPHY: CATH118249

## 2024-08-04 HISTORY — PX: CORONARY PRESSURE/FFR STUDY: CATH118243

## 2024-08-04 LAB — GLUCOSE, CAPILLARY
Glucose-Capillary: 151 mg/dL — ABNORMAL HIGH (ref 70–99)
Glucose-Capillary: 108 mg/dL — ABNORMAL HIGH (ref 70–99)

## 2024-08-04 LAB — POCT ACTIVATED CLOTTING TIME: Activated Clotting Time: 268 s

## 2024-08-04 SURGERY — LEFT HEART CATH AND CORONARY ANGIOGRAPHY
Anesthesia: LOCAL

## 2024-08-04 MED ORDER — SODIUM CHLORIDE 0.9% FLUSH: 3.0000 mL | INTRAVENOUS | Status: DC | PRN

## 2024-08-04 MED ORDER — HEPARIN (PORCINE) IN NACL 1000-0.9 UT/500ML-% IV SOLN
INTRAVENOUS | Status: DC | PRN
Start: 2024-08-04 — End: 2024-08-04
  Administered 2024-08-04: 1000 mL

## 2024-08-04 MED ORDER — FENTANYL CITRATE (PF) 100 MCG/2ML IJ SOLN
INTRAMUSCULAR | Status: AC
  Filled 2024-08-04: qty 2

## 2024-08-04 MED ORDER — LIDOCAINE HCL (PF) 1 % IJ SOLN
INTRAMUSCULAR | Status: AC
  Filled 2024-08-04: qty 30

## 2024-08-04 MED ORDER — FREE WATER: 500.0000 mL | Freq: Once | Status: DC

## 2024-08-04 MED ORDER — HEPARIN SODIUM (PORCINE) 1000 UNIT/ML IJ SOLN
INTRAMUSCULAR | Status: DC | PRN
  Administered 2024-08-04 (×2): 4500 [IU] via INTRAVENOUS

## 2024-08-04 MED ORDER — SODIUM CHLORIDE 0.9% FLUSH: 3.0000 mL | Freq: Two times a day (BID) | INTRAVENOUS | Status: DC

## 2024-08-04 MED ORDER — IOHEXOL 350 MG/ML SOLN
INTRAVENOUS | Status: DC | PRN
Start: 2024-08-04 — End: 2024-08-04
  Administered 2024-08-04: 40 mL

## 2024-08-04 MED ORDER — ACETAMINOPHEN 325 MG PO TABS: 650.0000 mg | ORAL_TABLET | ORAL | Status: DC | PRN

## 2024-08-04 MED ORDER — ADENOSINE (DIAGNOSTIC) 140MCG/KG/MIN
INTRAVENOUS | Status: DC | PRN
  Administered 2024-08-04: 140 ug/kg/min via INTRAVENOUS

## 2024-08-04 MED ORDER — ADENOSINE 12 MG/4ML IV SOLN
INTRAVENOUS | Status: AC
  Filled 2024-08-04: qty 16

## 2024-08-04 MED ORDER — HEPARIN SODIUM (PORCINE) 1000 UNIT/ML IJ SOLN
INTRAMUSCULAR | Status: AC
  Filled 2024-08-04: qty 10

## 2024-08-04 MED ORDER — ASPIRIN 81 MG PO CHEW: 81.0000 mg | CHEWABLE_TABLET | ORAL | Status: DC

## 2024-08-04 MED ORDER — FENTANYL CITRATE (PF) 100 MCG/2ML IJ SOLN
INTRAMUSCULAR | Status: DC | PRN
  Administered 2024-08-04: 25 ug via INTRAVENOUS

## 2024-08-04 MED ORDER — HYDRALAZINE HCL 20 MG/ML IJ SOLN: 10.0000 mg | INTRAMUSCULAR | Status: DC | PRN

## 2024-08-04 MED ORDER — NITROGLYCERIN 1 MG/10 ML FOR IR/CATH LAB
INTRA_ARTERIAL | Status: AC
  Filled 2024-08-04: qty 10

## 2024-08-04 MED ORDER — SODIUM CHLORIDE 0.9 % IV SOLN: 250.0000 mL | INTRAVENOUS | Status: DC | PRN

## 2024-08-04 MED ORDER — MIDAZOLAM HCL (PF) 2 MG/2ML IJ SOLN
INTRAMUSCULAR | Status: DC | PRN
  Administered 2024-08-04: 1 mg via INTRAVENOUS

## 2024-08-04 MED ORDER — LIDOCAINE HCL (PF) 1 % IJ SOLN
INTRAMUSCULAR | Status: DC | PRN
  Administered 2024-08-04: 2 mL

## 2024-08-04 MED ORDER — ONDANSETRON HCL 4 MG/2ML IJ SOLN: 4.0000 mg | Freq: Four times a day (QID) | INTRAMUSCULAR | Status: DC | PRN

## 2024-08-04 MED ORDER — MIDAZOLAM HCL 2 MG/2ML IJ SOLN
INTRAMUSCULAR | Status: AC
  Filled 2024-08-04: qty 2

## 2024-08-04 MED ORDER — LABETALOL HCL 5 MG/ML IV SOLN: 10.0000 mg | INTRAVENOUS | Status: DC | PRN

## 2024-08-04 MED ORDER — VERAPAMIL HCL 2.5 MG/ML IV SOLN
INTRAVENOUS | Status: AC
  Filled 2024-08-04: qty 2

## 2024-08-04 MED ORDER — VERAPAMIL HCL 2.5 MG/ML IV SOLN
INTRAVENOUS | Status: DC | PRN
  Administered 2024-08-04: 10 mL via INTRA_ARTERIAL

## 2024-08-04 SURGICAL SUPPLY — 10 items
CATH INFINITI AMBI 5FR TG (CATHETERS) IMPLANT
CATH VISTA GUIDE 6FR XBLD 3.5 (CATHETERS) IMPLANT
DEVICE RAD COMP TR BAND LRG (VASCULAR PRODUCTS) IMPLANT
GLIDESHEATH SLEND A-KIT 6F 22G (SHEATH) IMPLANT
GUIDEWIRE INQWIRE 1.5J.035X260 (WIRE) IMPLANT
GUIDEWIRE PRESSURE X 175 (WIRE) IMPLANT
KIT HEMO VALVE WATCHDOG (MISCELLANEOUS) IMPLANT
PACK CARDIAC CATHETERIZATION (CUSTOM PROCEDURE TRAY) ×2 IMPLANT
SET ATX-X65L (MISCELLANEOUS) IMPLANT
STATION PROTECTION PRESSURIZED (MISCELLANEOUS) IMPLANT

## 2024-08-04 NOTE — Progress Notes (Signed)
 Patient and patient wife given discharge instructions, education provided no further questions at this time. Patient able to ambulate and void before discharge. Able to tolerate PO intake. Patient site is clean, dry, intact with no hematoma noted upon discharge.

## 2024-08-04 NOTE — H&P (Signed)
 OV 08/01/2024 copied for documentation   Cardiology Office Note   Date:  08/04/2024  ID:  MAYFORD ALBERG, DOB Mar 26, 1951, MRN [id number] PCP: Leonel Cole, MD  [facility] Providers Cardiologist:  None     History of Present Illness Sean Hays is a 73 y.o. male with a past history of hypertension, type 2 diabetes, hyperlipidemia.  Presents today for evaluation of chest pain.  PCP concern for stable angina.  Today, patient presents for evaluation of chest pain, shortness of breath.  He has also been noticing that his pulse has been low and his BP has been running high.  Patient tells me that in July, he was outside mowing his yard on a hot day.  He developed chest pressure and shortness of breath.  He had to go inside to rest and his symptoms resolved.  Since then, he has had several similar episodes.  He notes that these episodes always occur when he is exerting himself and are relieved by rest.  At first he had symptoms when mowing the lawn.  Then, he started to have episodes when helping his wife vacuum.  More recently, he has been having episodes of chest pressure and shortness of breath when just walking outside to get his mail.  He has also noticed that his blood pressure has been running high at home.  BP often in the 140s-150s systolic.  He also has been having a low pulse rate, heart rate at times in the 40s but predominantly in the 50s.  No syncope or near syncope.  Patient has history of hypertension, hyperlipidemia, type 2 diabetes.  He denies tobacco use.  Denies family history of cardiac problems.  Studies Reviewed       Risk Assessment/Calculations           Physical Exam VS:  BP 129/73   Pulse (!) 41   Temp 97.9 F (36.6 C) (Oral)   Resp 15   Ht 5' 8 (1.727 m)   Wt 86.6 kg   SpO2 98%   BMI 29.04 kg/m        Wt Readings from Last 3 Encounters:  08/04/24 86.6 kg  08/01/24 87.4 kg  11/02/13 95.7 kg    GEN: Well nourished, well developed in no acute  distress. Sitting comfortably on the exam table  NECK: No JVD CARDIAC: RRR, no murmurs, rubs, gallops. Radial pulses 2+ bilaterally  RESPIRATORY:  Clear to auscultation without rales, wheezing or rhonchi. Normal WOB on room air   ABDOMEN: Soft, non-tender, non-distended EXTREMITIES:  No edema in BLE; No deformity   ASSESSMENT AND PLAN  Unstable Angina  - Patient has been having episodes of chest pressure and dyspnea on exertion since 03/2024.  Brought on by exertion and relieved by rest.  Initially, chest pressure and shortness of breath occurred while mowing his lawn.  Then, he had episodes while doing housework.  Has progressed to a point where he has chest pressure and shortness of breath when walking to his mailbox - Patient has multiple risk factors for CAD including hypertension, hyperlipidemia, type 2 diabetes - EKG today without ischemic changes - Presentation concerning for unstable angina.  Recommended cardiac catheterization.  Discussed with the who is in agreement.  Patient agrees to proceed -Ordered echocardiogram - Ordered BMP, CBC  - Continue aspirin 81 mg daily - Continue pravastatin 20 mg daily-plan to transition to high intensity statin pending cath results - Add amlodipine 5 mg daily for antianginal benefits - Sent in  prescription for sublingual nitroglycerin.  Educated patient on proper use of this. Discussed ED precautions   HTN  Bradycardia  - Patient has had elevated BP at home.  Often in the 140s-160s systolic.  Heart rate has been low in the 50s, at times in the 40s - Today, BP 140/92.  Heart rate 64 bpm on exam - Patient has been on the atenolol  50 mg twice daily.  Decrease this to 25 mg twice daily - Increase losartan to 100 mg daily - Add amlodipine 5 mg daily - Ordered BMP - Instructed patient to keep BP/heart rate log at home  Type 2 DM  -A1c around 7.0% - Followed by PCP.  On metformin   Hyperlipidemia - Lipid panel from 525 showed LDL not 1 -  Continue pravastatin 20 mg daily for now.  Can consider transition to high intensity statin pending cath results.  Did not adjust statin therapy today as we had adjusted multiple blood pressure medications as above  Informed Consent   Shared Decision Making/Informed Consent{  The risks [stroke (1 in 1000), death (1 in 1000), kidney failure [usually temporary] (1 in 500), bleeding (1 in 200), allergic reaction [possibly serious] (1 in 200)], benefits (diagnostic support and management of coronary artery disease) and alternatives of a cardiac catheterization were discussed in detail with Mr. Borunda and he is willing to proceed.     Dispo: Follow up in 3-4 weeks with APP. Patient requests to see Dr. Ladona long term   Signed, Newman JINNY Lawrence, MD

## 2024-08-05 ENCOUNTER — Encounter (HOSPITAL_COMMUNITY): Payer: Self-pay | Admitting: Cardiology

## 2024-08-05 MED FILL — Nitroglycerin IV Soln 100 MCG/ML in D5W: INTRA_ARTERIAL | Qty: 10 | Status: AC

## 2024-08-10 NOTE — Progress Notes (Unsigned)
  Cardiology Office Note   Date:  08/10/2024  ID:  ARBOR COHEN, DOB [birth year], MRN [id number] PCP: Leonel Cole, MD  [facility] Providers Cardiologist:  None   History of Present Illness Sean Hays is a 73 y.o. male with a past medical history of nonobstructive CAD, HTN, type 2 DM, HLD.   Patient was referred to cardiology for evaluation of chest pain. He was seen in the office 08/01/24 with episodes of bradycardia and hypertension as well as chest pain with exertion that was relieved with rest. The episodes were progressively worsening in frequency and intensity. An echocardiogram was ordered. He was encouraged to continue aspirin  and pravastatin, amlodipine  5 mg daily was started, atenolol  was decreased from 50 mg BID to 25 mg BID, and losartan  was increased to 100 mg daily. He was set up for a cardiac catheterization on 08/04/24. This showed moderate nonobstructive epicardial coronary artery disease, mildly elevated intramyocardial resistance, possible mild microvascular dysfunction. It was recommended that he be medically managed.  ? Meds, CP, HR, syncope, BP, edema  Nonobstructive CAD: Seen on 11/10 with symptoms concerning for progressive angina. Underwent cath on 08/04/24, results above. Medical management recommended. *** - ? Switch to atorvastatin 10-20 mg - Continue ASA 81 mg daily  - Continue amlodipine  5 mg daily  - Continue atenolol  25 mg BID  - Continue pravastatin 20 mg daily   Sinus Bradycardia: When seen 11/10, patient reported episodes of bradycardia with HR down to the upper 40s. Atenolol  was decreased from 50 mg BID to 25 mg BID  -   HTN  -  - Continue atenolol  25 mg BID, amlodipine  5 mg daily, losartan  100 mg daily  - K 4.6, creatinine 1.09 on 08/01/24  HLD  - Lipid panel from 5/25 showed LDL *** - ? Switch pravastatin to atorvastatin  Type 2 DM  -A1c around 7.0% - Followed by PCP.  On metformin   ROS: ***  Studies Reviewed      *** Risk  Assessment/Calculations {Does this patient have ATRIAL FIBRILLATION?[phone]} No BP recorded.  {Refresh Note OR Click here to enter BP  :1}***       Physical Exam VS:  There were no vitals taken for this visit.       Wt Readings from Last 3 Encounters:  08/04/24 191 lb (86.6 kg)  08/01/24 192 lb 9.6 oz (87.4 kg)  11/02/13 211 lb (95.7 kg)    GEN: Well nourished, well developed in no acute distress NECK: No JVD; No carotid bruits CARDIAC: ***RRR, no murmurs, rubs, gallops RESPIRATORY:  Clear to auscultation without rales, wheezing or rhonchi  ABDOMEN: Soft, non-tender, non-distended EXTREMITIES:  No edema; No deformity   ASSESSMENT AND PLAN ***    {Are you ordering a CV Procedure (e.g. stress test, cath, DCCV, TEE, etc)?   Press F2        :[id number]}  Dispo: ***  Signed, Rollo FABIENE Louder, PA-C

## 2024-08-23 NOTE — Progress Notes (Addendum)
 Cardiology Office Note   Date:  08/23/2024  ID:  FAROUK VIVERO [birth year] [id number] PCP: Leonel Cole, MD  [facility] Providers Cardiologist: Dr. Newman Lawrence   Chief Complaint: Sean Hays presents to the clinic for follow-up.    Patient was referred to cardiology for evaluation of chest pain. He was seen in the office 08/01/24 with episodes of bradycardia and hypertension as well as chest pain with exertion that was relieved with rest. The episodes were progressively worsening in frequency and intensity. An echocardiogram was ordered. He was encouraged to continue aspirin  and pravastatin, amlodipine  5 mg daily was started, atenolol  was decreased from 50 mg BID to 25 mg BID, and losartan  was increased to 100 mg daily. He was also set up for a cardiac catheterization on 08/04/24. 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 It was recommended that he be medically managed.      History of Present Illness: Sean Hays is a 73 y.o. male with a PMH of moderate nonobstructive CAD as above, HTN, type 2 DM, HLD who presents to the clinic for follow-up after cardiac catheterization.   He is doing remarkably well. One episode less than 5 minutes of chest pressure while sitting in recliner 11/29 that self-resolved. BP at that time 132/77, HR 42. Self-discontinued atenolol  BID, only taking once after this episode. Since then, BPs 130s-160s/80s-90s and HR 40s-70s. Has been able to rake leaves and vacuum the house without symptoms since catheterization. Otherwise reports no shortness of breath, dyspnea on exertion, chest pain, edema, headaches, vision changes.  ROS: Please see the history of present illness. All other systems reviewed and are negative.   Studies Reviewed: Cardiac Studies & Procedures    ______________________________________________________________________________________________ CARDIAC CATHETERIZATION  CARDIAC CATHETERIZATION 08/04/2024  Conclusion Images from the original result were not included. Coronary angiography 08/04/2024: LM: Normal LAD: Mid 50% stenosis Lcx: No significant disease RCA: Dominant vessel, no significant disease  LVEDP 16 mmHg  Coronary physiology testing 08/04/2024: Resting Pd/Pa: 0.96 FFR 0.91 CFR 2.4 IMR 28    Conclusion: Moderate nonobstructive epicardial coronary artery disease Mildly elevated intramyocardial resistance, possible mild microvascular dysfunction  Recommendation: Agree with medical management with Aspirin , statin, beta blocker (as tolerated by heart rate) and calcium  channel blocker Avoid long acting nitrates  Manish JINNY Lawrence, MD  Findings Coronary Findings Diagnostic  Dominance: Right  Left Main Vessel is normal in caliber. Vessel is angiographically normal.  Left Anterior Descending Mid LAD lesion is 50% stenosed.  Left Circumflex Vessel is normal in caliber. Vessel is angiographically normal.  Right Coronary Artery Vessel is normal in caliber. Vessel is angiographically normal.  Intervention  No interventions have been documented.              ______________________________________________________________________________________________      EKG 08/24/24: NSR 60 bpm  Physical Exam: BP (!) 150/68   Pulse 60   Ht 5' 8 (1.727 m)   Wt 192 lb 9.6 oz (87.4 kg)   SpO2 99%   BMI 29.28 kg/m    GEN: Please gentlemen, well nourished, well developed, NAD NECK: No JVD, no carotid bruits CARDIAC: RRR, no murmurs, rubs, gallops RESPIRATORY:  Clear to auscultation ABDOMEN: Soft, non-tender, non-distended EXTREMITIES: No edema  Assessment & Plan: 1. Nonobstructive CAD: Seen on 11/10 with symptoms concerning for progressive angina. Underwent cath on 08/04/24, results above and were  reviewed with patient. Medical management recommended. No concerning anginal symptoms since  last visit. - Change pravastatin to atorvastatin  40 mg daily - Increase amlodipine  to 10 mg daily for anti-anginal effect - Stop atenolol  with bradycardia as below - Continue aspirin  81 mg daily  - Echo scheduled 08/28/24  2. Hypertension: BP elevated at home, consistent with reading today. Amlodipine  added and losartan  increased at last visit. Has been on atenolol  for a very long time for BP control per PCP. Creatinine 1.09, K 4.6 08/02/24. - Stop atenolol  - Increase amlodipine  to 10 mg daily - Start spironolactone  12.5 mg daily - Check BMET in 2 weeks  3. Sinus Bradycardia: Bradycardia reported at last office visit. Atenolol  was decreased from 50 mg BID to 25 mg BID. EKG today shows NSR 60 bpm. On BP/HR log noted HR down to 40s several times. - Stop atenolol   4. Hyperlipidemia: Tolerated pravastatin for a long time per patient. Given moderate CAD on cath, will aggressively treat. Unable to see recent labs by PCP, patient will send via fax or MyChart today. Addendum: labs 07/27/24 show LDL 87, AST 14, ALT 10 - Switch pravastatin to atorvastatin  40 mg daily - Check lipid panel and hepatic function panel at follow-up visit    Dispo: Follow-up with APP in 2 months.   Signed, Saddie GORMAN Cleaves, NP 08/24/2024 10:36 AM [facility]

## 2024-08-24 ENCOUNTER — Ambulatory Visit: Payer: Self-pay

## 2024-08-24 ENCOUNTER — Encounter: Payer: Self-pay | Admitting: Cardiology

## 2024-08-24 ENCOUNTER — Ambulatory Visit: Attending: Cardiology

## 2024-08-24 VITALS — BP 150/68 | HR 60 | Ht 68.0 in | Wt 192.6 lb

## 2024-08-24 DIAGNOSIS — I10 Essential (primary) hypertension: Secondary | ICD-10-CM

## 2024-08-24 DIAGNOSIS — I2510 Atherosclerotic heart disease of native coronary artery without angina pectoris: Secondary | ICD-10-CM | POA: Diagnosis not present

## 2024-08-24 DIAGNOSIS — E782 Mixed hyperlipidemia: Secondary | ICD-10-CM | POA: Diagnosis not present

## 2024-08-24 DIAGNOSIS — R0789 Other chest pain: Secondary | ICD-10-CM | POA: Diagnosis not present

## 2024-08-24 DIAGNOSIS — R001 Bradycardia, unspecified: Secondary | ICD-10-CM

## 2024-08-24 MED ORDER — SPIRONOLACTONE 25 MG PO TABS: 12.5000 mg | ORAL_TABLET | Freq: Every day | ORAL | 3 refills | Status: AC

## 2024-08-24 MED ORDER — ATORVASTATIN CALCIUM 40 MG PO TABS: 40.0000 mg | ORAL_TABLET | Freq: Every day | ORAL | 3 refills | Status: AC

## 2024-08-24 MED ORDER — AMLODIPINE BESYLATE 10 MG PO TABS: 10.0000 mg | ORAL_TABLET | Freq: Every day | ORAL | 3 refills | Status: AC

## 2024-08-24 NOTE — Patient Instructions (Signed)
 Please fax copies of ALL labs from your last visit with Dr. Leonel to   Medication Instructions:  STOP pravastatin  STOP atenolol   START atorvastatin  40 mg daily START spironolactone  12.5 mg daily  INCREASE amlodipine  to 10 mg daily  *If you need a refill on your cardiac medications before your next appointment, please call your pharmacy*  Lab Work: In 2wks: BMET  Fax number: [phone] (Please fax resent labs)  If you have labs (blood work) drawn today and your tests are completely normal, you will receive your results only by: MyChart Message (if you have MyChart) OR A paper copy in the mail If you have any lab test that is abnormal or we need to change your treatment, we will call you to review the results.  Testing/Procedures: NONE  Follow-Up: At [facility], you and your health needs are our priority.  As part of our continuing mission to provide you with exceptional heart care, our providers are all part of one team.  This team includes your primary Cardiologist (physician) and Advanced Practice Providers or APPs (Physician Assistants and Nurse Practitioners) who all work together to provide you with the care you need, when you need it.  Your next appointment:   2 month(s)  Provider:   Any APP

## 2024-08-26 NOTE — Progress Notes (Signed)
 Sean Hays                                          MRN: [id number]   08/26/2024   The [facility] Quality Team Specialist reviewed this patient medical record for the purposes of chart review for care gap closure. The following were reviewed: chart review for care gap closure-controlling blood pressure.    [facility] Quality Team

## 2024-09-07 ENCOUNTER — Ambulatory Visit (HOSPITAL_COMMUNITY): Admission: RE | Admit: 2024-09-07 | Discharge: 2024-09-07 | Attending: Cardiology | Admitting: Cardiology

## 2024-09-07 DIAGNOSIS — I200 Unstable angina: Secondary | ICD-10-CM | POA: Insufficient documentation

## 2024-09-07 DIAGNOSIS — R0789 Other chest pain: Secondary | ICD-10-CM | POA: Diagnosis present

## 2024-09-07 DIAGNOSIS — R079 Chest pain, unspecified: Secondary | ICD-10-CM | POA: Diagnosis not present

## 2024-09-07 LAB — ECHOCARDIOGRAM COMPLETE
Area-P 1/2: 3.17 cm2
S' Lateral: 2.5 cm

## 2024-09-08 LAB — BASIC METABOLIC PANEL WITH GFR
BUN/Creatinine Ratio: 16 (ref 10–24)
BUN: 16 mg/dL (ref 8–27)
CO2: 23 mmol/L (ref 20–29)
Calcium: 10.1 mg/dL (ref 8.6–10.2)
Chloride: 101 mmol/L (ref 96–106)
Creatinine, Ser: 1.03 mg/dL (ref 0.76–1.27)
Glucose: 159 mg/dL — ABNORMAL HIGH (ref 70–99)
Potassium: 5 mmol/L (ref 3.5–5.2)
Sodium: 139 mmol/L (ref 134–144)
eGFR: 76 mL/min/1.73 (ref >59)

## 2024-10-25 ENCOUNTER — Ambulatory Visit: Admitting: Physician Assistant

## 2024-12-01 ENCOUNTER — Ambulatory Visit: Admitting: Emergency Medicine
